# Patient Record
Sex: Female | Born: 1940
Health system: Southern US, Community
[De-identification: ages and names within clinical notes are randomized; demographics above are authoritative.]

## PROBLEM LIST (undated history)

## (undated) DIAGNOSIS — K5792 Diverticulitis of intestine, part unspecified, without perforation or abscess without bleeding: Secondary | ICD-10-CM

## (undated) DIAGNOSIS — K649 Unspecified hemorrhoids: Secondary | ICD-10-CM

## (undated) DIAGNOSIS — K219 Gastro-esophageal reflux disease without esophagitis: Secondary | ICD-10-CM

## (undated) DIAGNOSIS — I10 Essential (primary) hypertension: Secondary | ICD-10-CM

## (undated) HISTORY — DX: Unspecified hemorrhoids: K64.9

## (undated) HISTORY — DX: Gastro-esophageal reflux disease without esophagitis: K21.9

## (undated) HISTORY — PX: CHOLECYSTECTOMY: SHX55

## (undated) HISTORY — DX: Essential (primary) hypertension: I10

## (undated) HISTORY — PX: SPINE SURGERY: SHX786

## (undated) HISTORY — PX: HEMORROIDECTOMY: SUR656

---

## 1969-08-21 HISTORY — PX: VAGINAL HYSTERECTOMY: SUR661

## 2013-01-21 ENCOUNTER — Encounter (INDEPENDENT_AMBULATORY_CARE_PROVIDER_SITE_OTHER): Payer: Medicare Other | Admitting: Family Medicine

## 2013-01-21 DIAGNOSIS — I10 Essential (primary) hypertension: Secondary | ICD-10-CM

## 2013-01-21 DIAGNOSIS — R5383 Other fatigue: Secondary | ICD-10-CM

## 2013-01-21 DIAGNOSIS — R5381 Other malaise: Secondary | ICD-10-CM

## 2013-01-21 DIAGNOSIS — E559 Vitamin D deficiency, unspecified: Secondary | ICD-10-CM

## 2013-01-21 LAB — POCT CBC
Granulocyte percent: 60.9 %G (ref 37–80)
HCT, POC: 41.8 % (ref 37.7–47.9)
Hemoglobin: 14.3 g/dL (ref 12.2–16.2)
Lymph, poc: 1.8 (ref 0.6–3.4)
MCH, POC: 29.1 pg (ref 27–31.2)
MCHC: 34.1 g/dL (ref 31.8–35.4)
MCV: 85.4 fL (ref 80–97)
MPV: 7.7 fL (ref 0–99.8)
POC Granulocyte: 3.3 (ref 2–6.9)
POC LYMPH PERCENT: 33.5 %L (ref 10–50)
Platelet Count, POC: 227 10*3/uL (ref 142–424)
RBC: 4.9 M/uL (ref 4.04–5.48)
RDW, POC: 13.9 %
WBC: 5.4 10*3/uL (ref 4.6–10.2)

## 2013-01-21 LAB — COMPLETE METABOLIC PANEL WITH GFR
ALT: 25 U/L (ref 0–35)
AST: 21 U/L (ref 0–37)
Albumin: 3.8 g/dL (ref 3.5–5.2)
Alkaline Phosphatase: 71 U/L (ref 39–117)
BUN: 17 mg/dL (ref 6–23)
CO2: 27 mEq/L (ref 19–32)
Calcium: 9.5 mg/dL (ref 8.4–10.5)
Chloride: 103 mEq/L (ref 96–112)
Creat: 0.83 mg/dL (ref 0.50–1.10)
GFR, Est African American: 81 mL/min
GFR, Est Non African American: 70 mL/min
Glucose, Bld: 95 mg/dL (ref 70–99)
Potassium: 4.3 mEq/L (ref 3.5–5.3)
Sodium: 144 mEq/L (ref 135–145)
Total Bilirubin: 0.5 mg/dL (ref 0.3–1.2)
Total Protein: 6.8 g/dL (ref 6.0–8.3)

## 2013-01-21 LAB — LIPID PANEL
Cholesterol: 127 mg/dL (ref 0–200)
HDL: 35 mg/dL — ABNORMAL LOW (ref >39)
LDL Cholesterol: 45 mg/dL (ref 0–99)
Total CHOL/HDL Ratio: 3.6 Ratio
Triglycerides: 237 mg/dL — ABNORMAL HIGH (ref <150)
VLDL: 47 mg/dL — ABNORMAL HIGH (ref 0–40)

## 2013-01-21 LAB — THYROID PANEL WITH TSH
Free Thyroxine Index: 2.3 (ref 1.0–3.9)
T3 Uptake: 33.1 % (ref 22.5–37.0)
T4, Total: 6.9 ug/dL (ref 5.0–12.5)
TSH: 1.867 u[IU]/mL (ref 0.350–4.500)

## 2013-01-22 LAB — VITAMIN D 25 HYDROXY (VIT D DEFICIENCY, FRACTURES): Vit D, 25-Hydroxy: 31 ng/mL (ref 30–89)

## 2013-01-22 NOTE — Progress Notes (Signed)
Quick Note:  Labs abnormal. Needs to see the pharmacist to review labs and treatment. ______

## 2013-01-27 ENCOUNTER — Telehealth: Payer: Self-pay | Admitting: Family Medicine

## 2013-01-28 NOTE — Telephone Encounter (Signed)
PT NOTIIFED BY April IN LAB FOR TEST RESULTS

## 2013-01-29 ENCOUNTER — Telehealth: Payer: Self-pay | Admitting: Family Medicine

## 2013-01-29 NOTE — Telephone Encounter (Signed)
Pt aware.

## 2013-02-07 ENCOUNTER — Other Ambulatory Visit: Payer: Self-pay | Admitting: Family Medicine

## 2013-02-10 ENCOUNTER — Other Ambulatory Visit: Payer: Self-pay | Admitting: *Deleted

## 2013-02-10 NOTE — Telephone Encounter (Signed)
LAST OV 2/14. 

## 2013-02-18 ENCOUNTER — Telehealth: Payer: Self-pay | Admitting: Family Medicine

## 2013-02-18 NOTE — Telephone Encounter (Signed)
appt changed to earlier date

## 2013-03-03 ENCOUNTER — Encounter: Payer: Self-pay | Admitting: Family Medicine

## 2013-03-03 ENCOUNTER — Ambulatory Visit (INDEPENDENT_AMBULATORY_CARE_PROVIDER_SITE_OTHER): Payer: Medicare Other | Admitting: Family Medicine

## 2013-03-03 VITALS — BP 153/85 | HR 81 | Temp 98.4°F | Wt 157.4 lb

## 2013-03-03 DIAGNOSIS — H8110 Benign paroxysmal vertigo, unspecified ear: Secondary | ICD-10-CM | POA: Insufficient documentation

## 2013-03-03 DIAGNOSIS — I10 Essential (primary) hypertension: Secondary | ICD-10-CM

## 2013-03-03 DIAGNOSIS — E785 Hyperlipidemia, unspecified: Secondary | ICD-10-CM | POA: Insufficient documentation

## 2013-03-03 DIAGNOSIS — J309 Allergic rhinitis, unspecified: Secondary | ICD-10-CM | POA: Insufficient documentation

## 2013-03-03 DIAGNOSIS — J019 Acute sinusitis, unspecified: Secondary | ICD-10-CM | POA: Insufficient documentation

## 2013-03-03 DIAGNOSIS — K219 Gastro-esophageal reflux disease without esophagitis: Secondary | ICD-10-CM | POA: Insufficient documentation

## 2013-03-03 DIAGNOSIS — J342 Deviated nasal septum: Secondary | ICD-10-CM | POA: Insufficient documentation

## 2013-03-03 MED ORDER — FLUTICASONE PROPIONATE 50 MCG/ACT NA SUSP: 2.0000 | Freq: Every day | NASAL | Status: DC

## 2013-03-03 MED ORDER — AMOXICILLIN 875 MG PO TABS: 875.0000 mg | ORAL_TABLET | Freq: Two times a day (BID) | ORAL | Status: DC

## 2013-03-03 NOTE — Progress Notes (Signed)
Patient ID: Melanie Todd, female   DOB: 11-Dec-1940, 72 y.o.   MRN: [id number] SUBJECTIVE: CC: Chief Complaint  Patient presents with  . Follow-up    3 month ck  discus changing bp med. . c/o drainage   HPI: Nasal congestion and  Dizziness from inner ear.  Also Patient is here for follow up of hypertension: denies Headache;deniesChest Pain;denies weakness;denies Shortness of Breath or Orthopnea;denies Visual changes;denies palpitations;denies cough;denies pedal edema;denies symptoms of TIA or stroke; admits to Compliance with medications. denies Problems with medications.  Past Medical History  Diagnosis Date  . Hypertension   . GERD (gastroesophageal reflux disease)    Past Surgical History  Procedure Laterality Date  . Spine surgery    . Hemorroidectomy     History   Social History  . Marital Status: Married    Spouse Name: N/A    Number of Children: N/A  . Years of Education: N/A   Occupational History  . Not on file.   Social History Main Topics  . Smoking status: Former Smoker    Quit date: 03/04/2003  . Smokeless tobacco: Not on file  . Alcohol Use: Not on file  . Drug Use: Not on file  . Sexually Active: Not on file   Other Topics Concern  . Not on file   Social History Narrative  . No narrative on file   Family History  Problem Relation Age of Onset  . Diabetes Mother   . Heart disease Mother   . Cancer Father     colon  . Cancer Brother     colon   Current Outpatient Prescriptions on File Prior to Visit  Medication Sig Dispense Refill  . verapamil (CALAN-SR) 240 MG CR tablet TAKE 1 TABLET BY MOUTH EVERY DAY  30 tablet  2   No current facility-administered medications on file prior to visit.   Allergies  Allergen Reactions  . Codeine   . Sulfa Antibiotics     There is no immunization history on file for this patient. Prior to Admission medications   Medication Sig Start Date End Date Taking? Authorizing Provider  verapamil (CALAN-SR) 240  MG CR tablet TAKE 1 TABLET BY MOUTH EVERY DAY 02/07/13  Yes Ernestina Penna, MD     ROS: As above in the HPI. All other systems are stable or negative.  OBJECTIVE: APPEARANCE:  Patient in no acute distress.The patient appeared well nourished and normally developed. Acyanotic. Waist: VITAL SIGNS:BP 153/85  Pulse 81  Temp(Src) 98.4 F (36.9 C) (Oral)  Wt 157 lb 6.4 oz (71.396 kg) WF  Recheck 135/85 Tilt negative.   SKIN: warm and  Dry without overt rashes, tattoos and scars  HEAD and Neck: without JVD, Head and scalp: normal Eyes:No scleral icterus. Fundi normal, eye movements normal. Ears: Auricle normal, canal normal, Tympanic membranes small effusion., insufflation normal. Nose: septal deviation and  Nasal boggy swollen mucosa. Throat: normal Neck & thyroid: normal  CHEST & LUNGS: Chest wall: normal Lungs: Clear  CVS: Reveals the PMI to be normally located. Regular rhythm, First and Second Heart sounds are normal,  absence of murmurs, rubs or gallops. Peripheral vasculature: Radial pulses: normal Dorsal pedis pulses: normal Posterior pulses: normal  ABDOMEN:  Appearance: normal Benign, no organomegaly, no masses, no Abdominal Aortic enlargement. No Guarding , no rebound. No Bruits. Bowel sounds: normal  RECTAL: N/A GU: N/A  EXTREMETIES: nonedematous. Both Femoral and Pedal pulses are normal.  MUSCULOSKELETAL:  Spine: normal Joints: intact  NEUROLOGIC: oriented to  time,place and person; nonfocal. Strength is normal Sensory is normal Reflexes are normal Cranial Nerves are normal. Dix-hallpike mildly positive.  Results for orders placed in visit on 01/21/13  COMPLETE METABOLIC PANEL WITH GFR      Result Value Range   Sodium 144  135 - 145 mEq/L   Potassium 4.3  3.5 - 5.3 mEq/L   Chloride 103  96 - 112 mEq/L   CO2 27  19 - 32 mEq/L   Glucose, Bld 95  70 - 99 mg/dL   BUN 17  6 - 23 mg/dL   Creat 0.98  1.19 - 1.47 mg/dL   Total Bilirubin 0.5  0.3  - 1.2 mg/dL   Alkaline Phosphatase 71  39 - 117 U/L   AST 21  0 - 37 U/L   ALT 25  0 - 35 U/L   Total Protein 6.8  6.0 - 8.3 g/dL   Albumin 3.8  3.5 - 5.2 g/dL   Calcium 9.5  8.4 - 82.9 mg/dL   GFR, Est African American 81     GFR, Est Non African American 70    LIPID PANEL      Result Value Range   Cholesterol 127  0 - 200 mg/dL   Triglycerides 562 (*) <150 mg/dL   HDL 35 (*) >13 mg/dL   Total CHOL/HDL Ratio 3.6     VLDL 47 (*) 0 - 40 mg/dL   LDL Cholesterol 45  0 - 99 mg/dL  VITAMIN D 25 HYDROXY      Result Value Range   Vit D, 25-Hydroxy 31  30 - 89 ng/mL  THYROID PANEL WITH TSH      Result Value Range   T4, Total 6.9  5.0 - 12.5 ug/dL   T3 Uptake 08.6  57.8 - 37.0 %   Free Thyroxine Index 2.3  1.0 - 3.9   TSH 1.867  0.350 - 4.500 uIU/mL  POCT CBC      Result Value Range   WBC 5.4  4.6 - 10.2 K/uL   Lymph, poc 1.8  0.6 - 3.4   POC LYMPH PERCENT 33.5  10 - 50 %L   POC Granulocyte 3.3  2 - 6.9   Granulocyte percent 60.9  37 - 80 %G   RBC 4.9  4.04 - 5.48 M/uL   Hemoglobin 14.3  12.2 - 16.2 g/dL   HCT, POC 46.9  62.9 - 47.9 %   MCV 85.4  80 - 97 fL   MCH, POC 29.1  27 - 31.2 pg   MCHC 34.1  31.8 - 35.4 g/dL   RDW, POC 52.8     Platelet Count, POC 227.0  142 - 424 K/uL   MPV 7.7  0 - 99.8 fL    ASSESSMENT: HTN (hypertension)  GERD (gastroesophageal reflux disease) - resolved  HLD (hyperlipidemia)  Deviated nasal septum  Allergic rhinitis - Plan: fluticasone (FLONASE) 50 MCG/ACT nasal spray  Benign paroxysmal positional vertigo - Plan: fluticasone (FLONASE) 50 MCG/ACT nasal spray  Sinusitis, acute - Plan: amoxicillin (AMOXIL) 875 MG tablet  PLAN: Meds ordered this encounter  Medications  . fluticasone (FLONASE) 50 MCG/ACT nasal spray    Sig: Place 2 sprays into the nose daily.    Dispense:  16 g    Refill:  6  . amoxicillin (AMOXIL) 875 MG tablet    Sig: Take 1 tablet (875 mg total) by mouth 2 (two) times daily.    Dispense:  20 tablet    Refill:  0    Reviewed labs with patient. She is to follow up with the clinical pharmacist.  Return in about 3 months (around 06/03/2013) for Recheck medical problems,follow up with Michlle as planned.Thelma Barge P. [place] Charon, M.D.

## 2013-03-21 ENCOUNTER — Ambulatory Visit: Payer: Medicare Other | Admitting: Family Medicine

## 2013-04-09 IMAGING — CR DG HIP COMPLETE 2+V*R*
2 series · 2 of 2 positions shown · non-contrast
Comparison: None.

CLINICAL DATA: 2-week history of painful hip.

RIGHT HIP - COMPLETE 2+ VIEW

[view not recorded (1 of 2)]
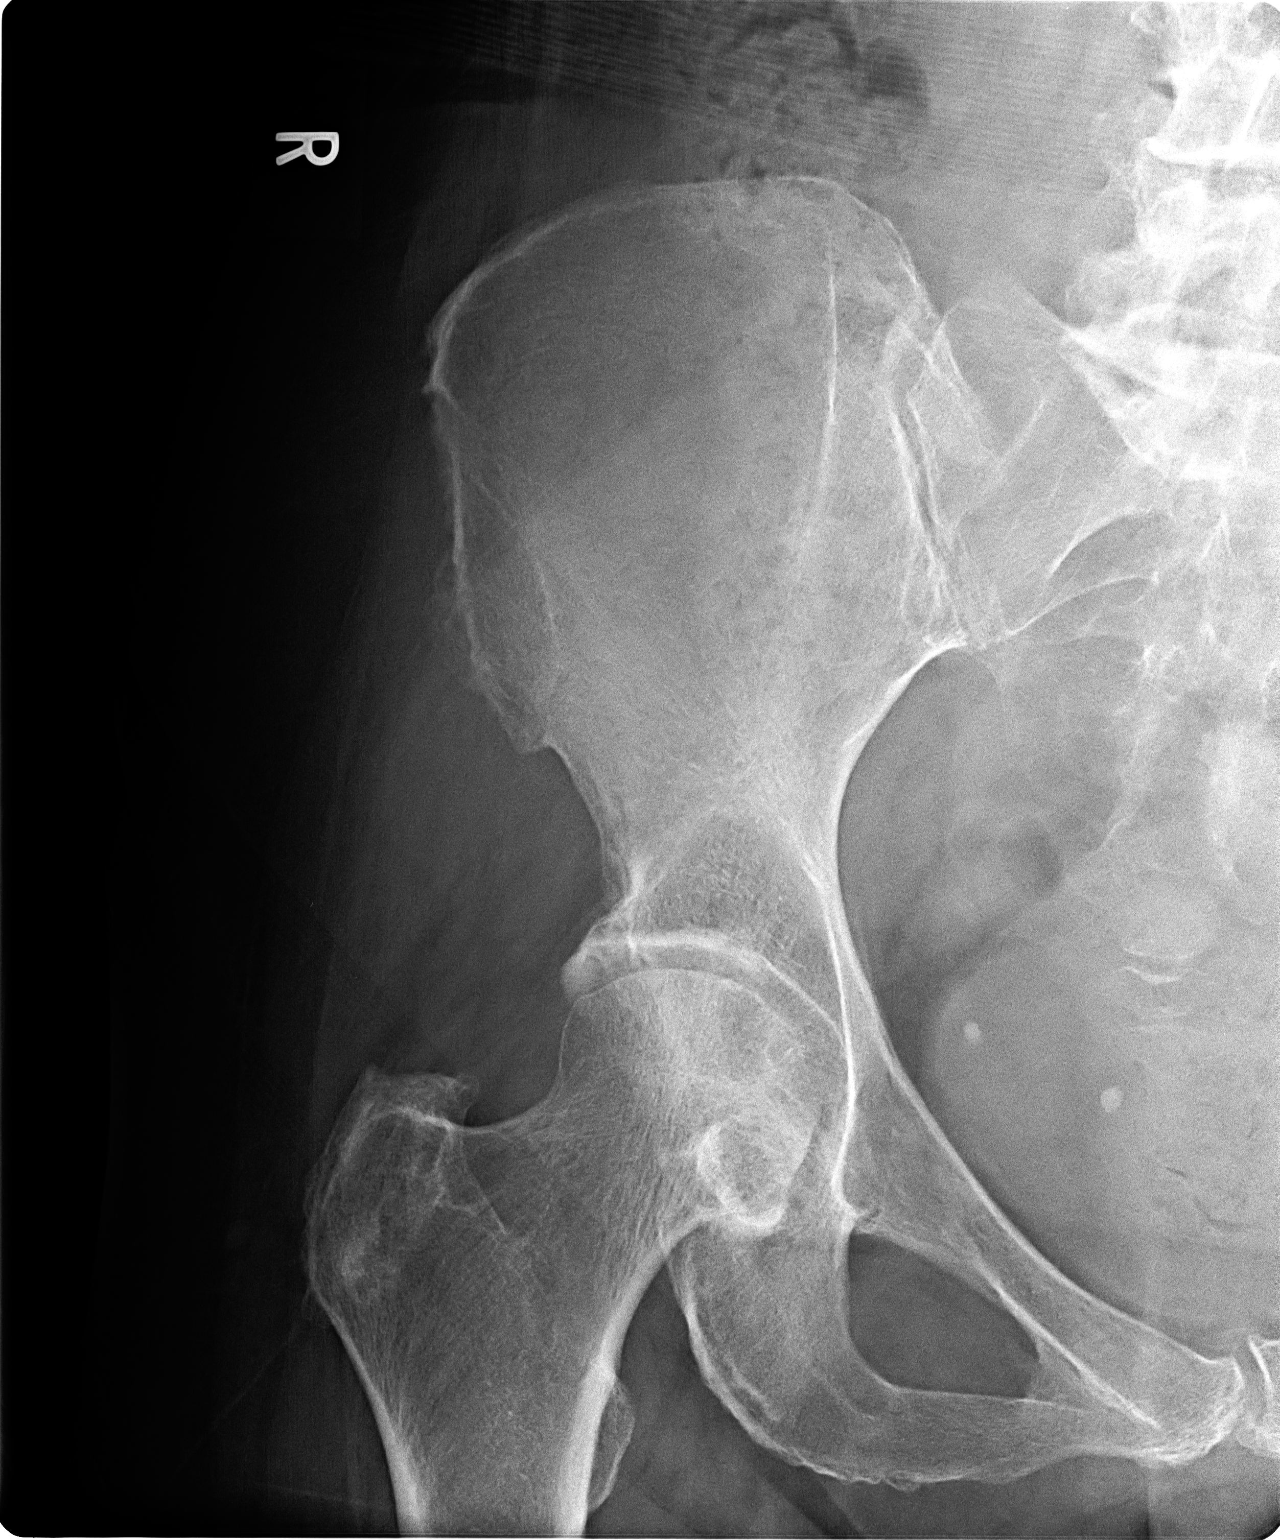

[view not recorded (2 of 2)]
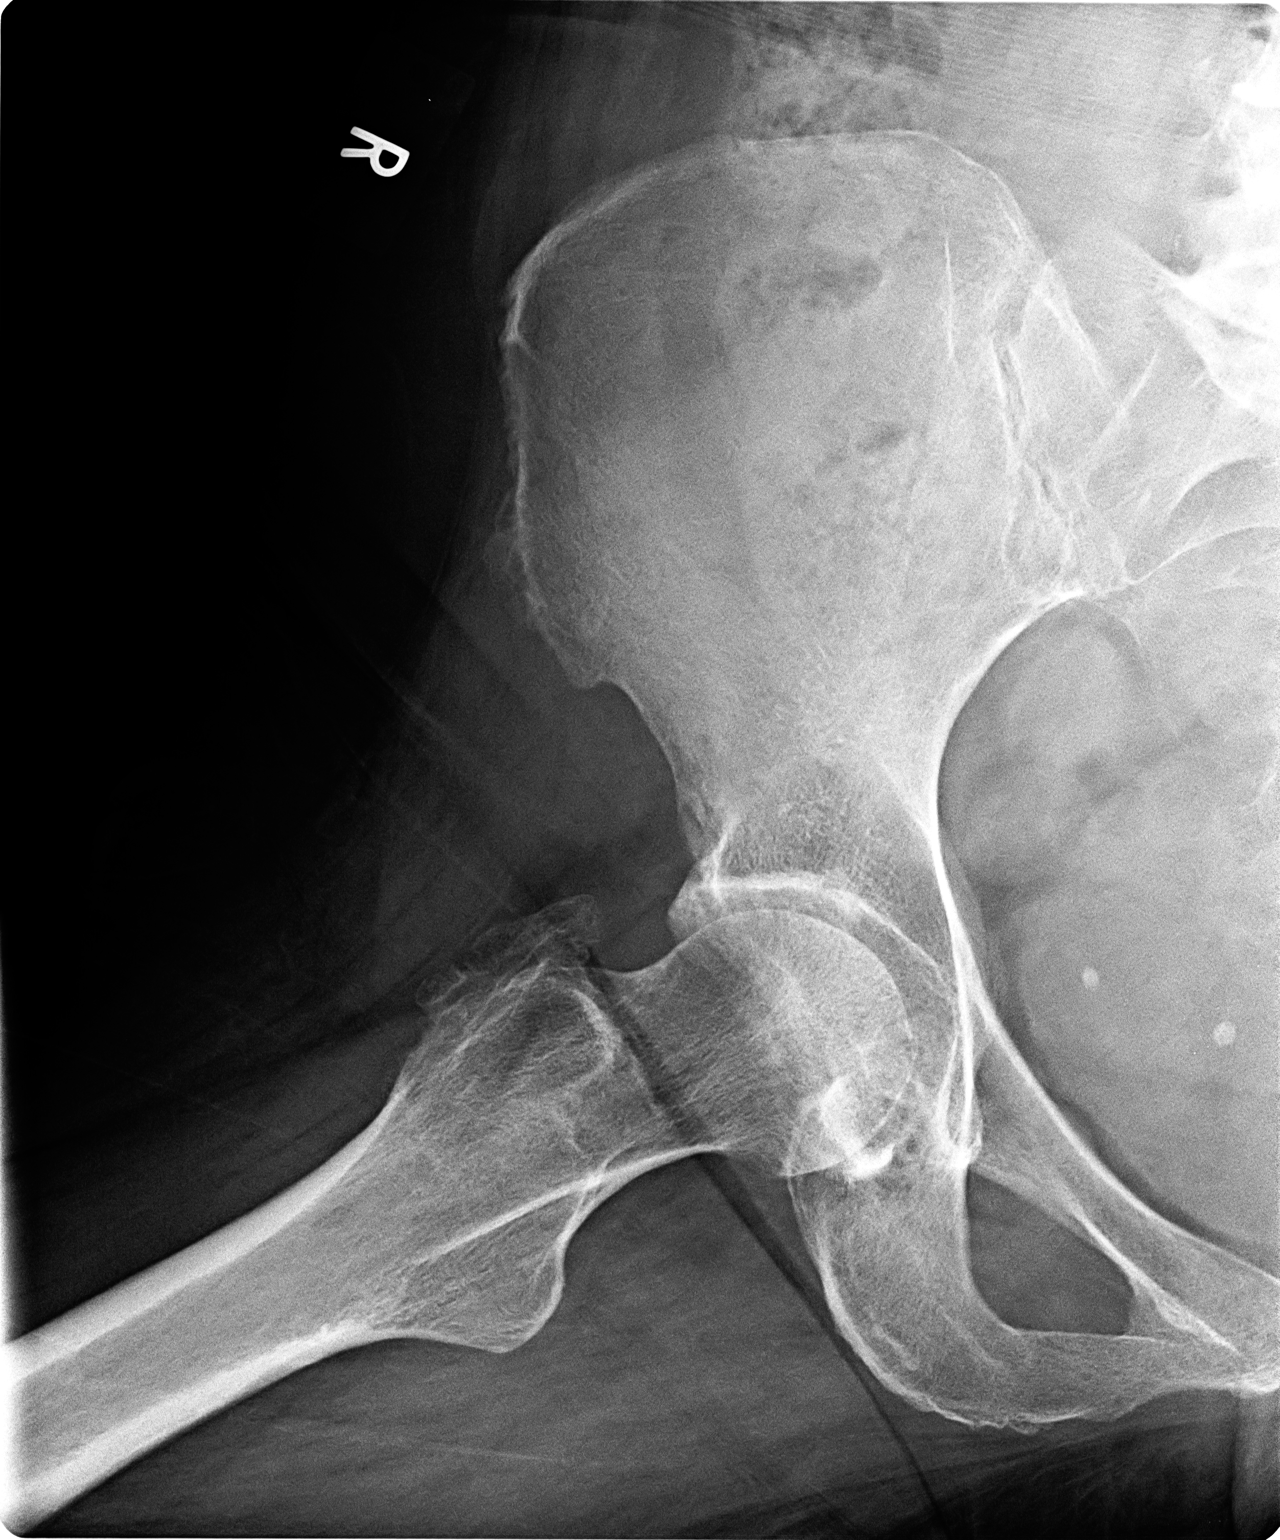

[2 of 2 positions shown; findings below may reference images not displayed]

FINDINGS: There is degenerative spondylosis.  Pelvic phleboliths
are present.  Hip joint space appears preserved.  There is moderate
acetabular spurring. No fracture or bony destruction is seen.  No
calcific bursitis is evident..
IMPRESSION: Moderate right acetabular spurring.  No fracture bony destruction.
No calcific bursitis.  Degenerative spondylosis.

## 2013-04-22 ENCOUNTER — Other Ambulatory Visit: Payer: Self-pay | Admitting: Family Medicine

## 2013-04-24 NOTE — Telephone Encounter (Signed)
Last seen 10/08/12  FPW

## 2013-06-05 ENCOUNTER — Ambulatory Visit: Payer: Medicare Other | Admitting: Family Medicine

## 2014-04-30 ENCOUNTER — Encounter: Payer: Self-pay | Admitting: Family Medicine

## 2014-04-30 ENCOUNTER — Ambulatory Visit (INDEPENDENT_AMBULATORY_CARE_PROVIDER_SITE_OTHER): Payer: Medicare Other | Admitting: Family Medicine

## 2014-04-30 VITALS — BP 123/75 | HR 76 | Temp 97.0°F | Ht 64.0 in | Wt 153.0 lb

## 2014-04-30 DIAGNOSIS — I10 Essential (primary) hypertension: Secondary | ICD-10-CM

## 2014-04-30 DIAGNOSIS — Z780 Asymptomatic menopausal state: Secondary | ICD-10-CM

## 2014-04-30 DIAGNOSIS — E785 Hyperlipidemia, unspecified: Secondary | ICD-10-CM

## 2014-04-30 DIAGNOSIS — Z13820 Encounter for screening for osteoporosis: Secondary | ICD-10-CM

## 2014-04-30 NOTE — Progress Notes (Signed)
   Subjective:    Patient ID: Melanie Todd, female    DOB: 01-15-1941, 73 y.o.   MRN: [id number]  HPI 73 year old female who is transferred here from [facility] where the physician extender treated her blood pressure. She has no problems with the verapamil which she takes for blood pressure. She also takes tramadol daily for what she calls sinus headaches. She has GERD and uses Prilosec on a daily basis. There is a remote history of cancer found when she had hemorrhoid surgery. That was about 6 years ago. She still sees a gynecologist for Pap smears.    Review of Systems  Constitutional: Negative.   Eyes: Negative.   Respiratory: Negative.   Cardiovascular: Negative.   Gastrointestinal: Negative.   Endocrine: Negative.   Genitourinary: Negative.   Musculoskeletal: Positive for myalgias.       Bilateral thumb pain  Skin: Rash: not true rash but healing intertrigo.  Neurological: Positive for headaches.  Hematological: Negative.   Psychiatric/Behavioral: Negative.        Objective:   Physical Exam  Constitutional: She is oriented to person, place, and time. She appears well-developed and well-nourished.  Eyes: Conjunctivae and EOM are normal.  Neck: Normal range of motion. Neck supple.  Cardiovascular: Normal rate, regular rhythm and normal heart sounds.   Pulmonary/Chest: Effort normal and breath sounds normal.  Abdominal: Soft. Bowel sounds are normal.  Musculoskeletal: Normal range of motion.  Neurological: She is alert and oriented to person, place, and time. She has normal reflexes.  Skin: Skin is warm and dry.  Psychiatric: She has a normal mood and affect. Her behavior is normal. Thought content normal.    BP 123/75  Pulse 76  Temp(Src) 97 F (36.1 C) (Oral)  Ht 5\' 4"  (1.626 m)  Wt 153 lb (69.4 kg)  BMI 26.25 kg/m2      Assessment & Plan:  1. Essential hypertension Continue with same med  2. HLD (hyperlipidemia) Had recent labs at outside office  Will send  for result  Needs bone density  Wardell Honour MD

## 2014-04-30 NOTE — Patient Instructions (Signed)

## 2014-05-27 ENCOUNTER — Encounter: Payer: Self-pay | Admitting: Pharmacist

## 2014-05-27 ENCOUNTER — Ambulatory Visit (INDEPENDENT_AMBULATORY_CARE_PROVIDER_SITE_OTHER): Payer: Medicare Other

## 2014-05-27 ENCOUNTER — Ambulatory Visit (INDEPENDENT_AMBULATORY_CARE_PROVIDER_SITE_OTHER): Payer: Medicare Other | Admitting: Pharmacist

## 2014-05-27 VITALS — Ht 64.0 in | Wt 153.0 lb

## 2014-05-27 DIAGNOSIS — Z23 Encounter for immunization: Secondary | ICD-10-CM

## 2014-05-27 DIAGNOSIS — Z780 Asymptomatic menopausal state: Secondary | ICD-10-CM

## 2014-05-27 DIAGNOSIS — M8580 Other specified disorders of bone density and structure, unspecified site: Secondary | ICD-10-CM | POA: Insufficient documentation

## 2014-05-27 LAB — HM DEXA SCAN

## 2014-05-27 NOTE — Progress Notes (Signed)
Patient ID: Melanie Todd, female   DOB: 1941/02/17, 73 y.o.   MRN: [id number] [facility] Current Height: Height: 5\' 4"  (162.6 cm)      Max Lifetime Height:  5\' 5"  Current Weight: Weight: 153 lb (69.4 kg)       Ethnicity:Caucasian    HPI: Does pt already have a diagnosis of:  Osteopenia?  No Osteoporosis?  No  Back Pain?  Yes    - history of back surgery    Kyphosis?  No Prior fracture?  No Med(s) for Osteoporosis/Osteopenia:  none Med(s) previously tried for Osteoporosis/Osteopenia:  none                                                             PMH: Age at menopause:  Surgical at 73yo Hysterectomy?  Yes Oophorectomy?  No HRT? No Steroid Use?  No Thyroid med?  No History of cancer?  Yes - skin History of digestive disorders (ie Crohn's)?  Yes - on chronic PPI therapy Current or previous eating disorders?  No Last Vitamin D Result:  31 (01/22/2013) Last GFR Result:  81 (01/21/2013)   FH/SH: Family history of osteoporosis?  Yes - mother and mat grandmother Parent with history of hip fracture?  No Family history of breast cancer?  No Exercise?  No Smoking?  No Alcohol?  No    Calcium Assessment Calcium Intake  # of servings/day  Calcium mg  Milk (8 oz) 1  x  300  = 300mg   Yogurt (4 oz) 0 x  200 = 0  Cheese (1 oz) 0 x  200 = 0  Other Calcium sources   250mg   Ca supplement 400mg  = 400mg    950mg     DEXA Results Date of Test T-Score for AP Spine L1-L4 T-Score for Total Left Hip T-Score for Total Right Hip  05/27/2014 0.8 -1.0 -1.3                   FRAX 10 year estimate: Total FX risk:  14%  (consider medication if >/= 20%) Hip FX risk:  3.8%  (consider medication if >/= 3%)  Assessment: Osteopenia with increased fracture risk  Recommendations: 1.  Discussed fracture risk and recommended evista -  Patient refused pharmacotherapy for osteopenia 2.  recommend calcium 1200mg  daily through supplementation or diet.  3.  recommend weight bearing exercise  - 30 minutes at least 4 days per week.   4.  Counseled and educated about fall risk and prevention. 5.   Influenza vaccine given in office today   Recheck DEXA:  2 years  Time spent counseling patient:  25 minutes

## 2014-05-27 NOTE — Patient Instructions (Signed)

## 2014-05-28 ENCOUNTER — Ambulatory Visit: Payer: Medicare Other | Admitting: Family Medicine

## 2014-06-29 ENCOUNTER — Other Ambulatory Visit: Payer: Self-pay

## 2014-06-29 MED ORDER — OMEPRAZOLE 20 MG PO CPDR: DELAYED_RELEASE_CAPSULE | ORAL | Status: DC

## 2014-07-23 ENCOUNTER — Telehealth: Payer: Self-pay | Admitting: Family Medicine

## 2014-07-23 NOTE — Telephone Encounter (Signed)
Please review

## 2014-07-24 ENCOUNTER — Other Ambulatory Visit: Payer: Self-pay

## 2014-07-24 DIAGNOSIS — R748 Abnormal levels of other serum enzymes: Secondary | ICD-10-CM

## 2014-07-24 NOTE — Telephone Encounter (Signed)
Okay to do referral to GI but would be unlikely that she will be seen as quickly as she indicated

## 2014-08-25 ENCOUNTER — Encounter: Payer: Self-pay | Admitting: Family Medicine

## 2014-08-25 ENCOUNTER — Ambulatory Visit (INDEPENDENT_AMBULATORY_CARE_PROVIDER_SITE_OTHER): Payer: Medicare Other | Admitting: Family Medicine

## 2014-08-25 VITALS — BP 159/77 | HR 70 | Temp 97.7°F | Ht 64.0 in | Wt 154.0 lb

## 2014-08-25 DIAGNOSIS — K219 Gastro-esophageal reflux disease without esophagitis: Secondary | ICD-10-CM

## 2014-08-25 DIAGNOSIS — I10 Essential (primary) hypertension: Secondary | ICD-10-CM

## 2014-08-25 NOTE — Progress Notes (Signed)
   Subjective:    Patient ID: Melanie Todd, female    DOB: [birth year], 74 y.o.   MRN: [id number]  HPI 74 year old female here to follow-up hypertension and chronic. I note that since her last visit here she had an elevated amylase done at outside urgent care. I do not know the significance of that as yet. She has apparently seen a gastroenterologist and undergone ultrasound as well as CT scan. Amylase elevation seems to be persistent and she has another follow-up visit in the coming weeks. She has had no further abdominal pain nausea or vomiting to suggest pancreatitis but they seem to be thinking more of a retained stone after her gallbladder surgery as a calls. There is no history of alcoholism or trauma    Review of Systems  Constitutional: Negative.   HENT: Negative.   Eyes: Negative.   Respiratory: Negative.   Cardiovascular: Negative.   Gastrointestinal: Positive for abdominal pain.  Endocrine: Negative.   Genitourinary: Negative.   Hematological: Negative.   Psychiatric/Behavioral: Negative.        Objective:   Physical Exam  Constitutional: She is oriented to person, place, and time. She appears well-developed and well-nourished.  Eyes: Conjunctivae and EOM are normal.  Neck: Normal range of motion. Neck supple.  Cardiovascular: Normal rate, regular rhythm and normal heart sounds.   Pulmonary/Chest: Effort normal and breath sounds normal.  Abdominal: Soft. Bowel sounds are normal.  Musculoskeletal: Normal range of motion.  Neurological: She is alert and oriented to person, place, and time. She has normal reflexes.  Skin: Skin is warm and dry.  Psychiatric: She has a normal mood and affect. Her behavior is normal. Thought content normal.   BP 159/77 mmHg  Pulse 70  Temp(Src) 97.7 F (36.5 C) (Oral)  Ht 5\' 4"  (1.626 m)  Wt 154 lb (69.854 kg)  BMI 26.42 kg/m2       Assessment & Plan:  1. Essential hypertension  BP elevated today; she will monitor  2.  Gastroesophageal reflux disease without esophagitis   Wardell Honour MD

## 2014-10-22 ENCOUNTER — Telehealth: Payer: Self-pay | Admitting: Family Medicine

## 2014-10-22 NOTE — Telephone Encounter (Signed)
Discussed with patient. This was documented as a historical medication but we haven't prescribed it for her. There is no documentation as to why she is taking this but she reports that it is for headaches and cervical disc disease. Advised that I will forward to Dr Sabra Heck to review when he returns on 10/27/14. Patient stated understanding.

## 2014-10-22 NOTE — Telephone Encounter (Signed)
i dont see where we have filled this here?

## 2014-10-26 ENCOUNTER — Telehealth: Payer: Self-pay | Admitting: Family Medicine

## 2014-10-27 MED ORDER — TRAMADOL HCL 50 MG PO TABS: 50.0000 mg | ORAL_TABLET | Freq: Every day | ORAL | Status: DC

## 2014-10-27 NOTE — Telephone Encounter (Signed)
Request for tramadol refill

## 2014-10-27 NOTE — Telephone Encounter (Signed)
Patient aware that script is ready to be picked up with photo ID 

## 2015-04-05 ENCOUNTER — Telehealth: Payer: Self-pay | Admitting: *Deleted

## 2015-04-05 NOTE — Telephone Encounter (Signed)
lmtcb to schedule follow up and recheck BP.

## 2015-10-07 DIAGNOSIS — R300 Dysuria: Secondary | ICD-10-CM | POA: Diagnosis not present

## 2015-10-07 DIAGNOSIS — B373 Candidiasis of vulva and vagina: Secondary | ICD-10-CM | POA: Diagnosis not present

## 2015-10-21 DIAGNOSIS — R42 Dizziness and giddiness: Secondary | ICD-10-CM | POA: Diagnosis not present

## 2015-10-21 DIAGNOSIS — J329 Chronic sinusitis, unspecified: Secondary | ICD-10-CM | POA: Diagnosis not present

## 2015-11-11 DIAGNOSIS — Z09 Encounter for follow-up examination after completed treatment for conditions other than malignant neoplasm: Secondary | ICD-10-CM | POA: Diagnosis not present

## 2015-11-11 DIAGNOSIS — D013 Carcinoma in situ of anus and anal canal: Secondary | ICD-10-CM | POA: Diagnosis not present

## 2015-11-24 DIAGNOSIS — H40003 Preglaucoma, unspecified, bilateral: Secondary | ICD-10-CM | POA: Diagnosis not present

## 2015-11-24 DIAGNOSIS — H269 Unspecified cataract: Secondary | ICD-10-CM | POA: Diagnosis not present

## 2015-11-25 DIAGNOSIS — Z800 Family history of malignant neoplasm of digestive organs: Secondary | ICD-10-CM | POA: Diagnosis not present

## 2015-11-25 DIAGNOSIS — Z1211 Encounter for screening for malignant neoplasm of colon: Secondary | ICD-10-CM | POA: Diagnosis not present

## 2015-11-25 DIAGNOSIS — K921 Melena: Secondary | ICD-10-CM | POA: Diagnosis not present

## 2015-11-25 DIAGNOSIS — K5730 Diverticulosis of large intestine without perforation or abscess without bleeding: Secondary | ICD-10-CM | POA: Diagnosis not present

## 2015-12-31 DIAGNOSIS — N952 Postmenopausal atrophic vaginitis: Secondary | ICD-10-CM | POA: Diagnosis not present

## 2015-12-31 DIAGNOSIS — Z1272 Encounter for screening for malignant neoplasm of vagina: Secondary | ICD-10-CM | POA: Diagnosis not present

## 2015-12-31 DIAGNOSIS — N3090 Cystitis, unspecified without hematuria: Secondary | ICD-10-CM | POA: Diagnosis not present

## 2015-12-31 DIAGNOSIS — L819 Disorder of pigmentation, unspecified: Secondary | ICD-10-CM | POA: Diagnosis not present

## 2016-01-07 DIAGNOSIS — H40003 Preglaucoma, unspecified, bilateral: Secondary | ICD-10-CM | POA: Diagnosis not present

## 2016-01-26 DIAGNOSIS — B370 Candidal stomatitis: Secondary | ICD-10-CM | POA: Diagnosis not present

## 2016-01-26 DIAGNOSIS — J0140 Acute pansinusitis, unspecified: Secondary | ICD-10-CM | POA: Diagnosis not present

## 2016-01-26 DIAGNOSIS — J029 Acute pharyngitis, unspecified: Secondary | ICD-10-CM | POA: Diagnosis not present

## 2016-02-15 DIAGNOSIS — L820 Inflamed seborrheic keratosis: Secondary | ICD-10-CM | POA: Diagnosis not present

## 2016-02-15 DIAGNOSIS — D229 Melanocytic nevi, unspecified: Secondary | ICD-10-CM | POA: Diagnosis not present

## 2016-02-15 DIAGNOSIS — L814 Other melanin hyperpigmentation: Secondary | ICD-10-CM | POA: Diagnosis not present

## 2016-02-15 DIAGNOSIS — D485 Neoplasm of uncertain behavior of skin: Secondary | ICD-10-CM | POA: Diagnosis not present

## 2016-02-15 DIAGNOSIS — C44722 Squamous cell carcinoma of skin of right lower limb, including hip: Secondary | ICD-10-CM | POA: Diagnosis not present

## 2016-02-29 DIAGNOSIS — H9203 Otalgia, bilateral: Secondary | ICD-10-CM | POA: Diagnosis not present

## 2016-02-29 DIAGNOSIS — H6983 Other specified disorders of Eustachian tube, bilateral: Secondary | ICD-10-CM | POA: Diagnosis not present

## 2016-02-29 DIAGNOSIS — H8113 Benign paroxysmal vertigo, bilateral: Secondary | ICD-10-CM | POA: Diagnosis not present

## 2016-03-23 DIAGNOSIS — C44722 Squamous cell carcinoma of skin of right lower limb, including hip: Secondary | ICD-10-CM | POA: Diagnosis not present

## 2016-05-01 DIAGNOSIS — N761 Subacute and chronic vaginitis: Secondary | ICD-10-CM | POA: Diagnosis not present

## 2016-05-14 DIAGNOSIS — J329 Chronic sinusitis, unspecified: Secondary | ICD-10-CM | POA: Diagnosis not present

## 2016-05-23 DIAGNOSIS — M545 Low back pain: Secondary | ICD-10-CM | POA: Diagnosis not present

## 2016-05-23 DIAGNOSIS — J019 Acute sinusitis, unspecified: Secondary | ICD-10-CM | POA: Diagnosis not present

## 2016-05-23 DIAGNOSIS — I10 Essential (primary) hypertension: Secondary | ICD-10-CM | POA: Diagnosis not present

## 2016-05-23 DIAGNOSIS — K219 Gastro-esophageal reflux disease without esophagitis: Secondary | ICD-10-CM | POA: Diagnosis not present

## 2016-05-30 DIAGNOSIS — L292 Pruritus vulvae: Secondary | ICD-10-CM | POA: Diagnosis not present

## 2016-06-16 DIAGNOSIS — Z23 Encounter for immunization: Secondary | ICD-10-CM | POA: Diagnosis not present

## 2016-07-03 DIAGNOSIS — Z1231 Encounter for screening mammogram for malignant neoplasm of breast: Secondary | ICD-10-CM | POA: Diagnosis not present

## 2016-07-05 DIAGNOSIS — H40013 Open angle with borderline findings, low risk, bilateral: Secondary | ICD-10-CM | POA: Diagnosis not present

## 2016-07-05 DIAGNOSIS — H2513 Age-related nuclear cataract, bilateral: Secondary | ICD-10-CM | POA: Diagnosis not present

## 2016-07-11 DIAGNOSIS — J0140 Acute pansinusitis, unspecified: Secondary | ICD-10-CM | POA: Diagnosis not present

## 2016-07-14 DIAGNOSIS — H8113 Benign paroxysmal vertigo, bilateral: Secondary | ICD-10-CM | POA: Diagnosis not present

## 2016-10-02 DIAGNOSIS — R590 Localized enlarged lymph nodes: Secondary | ICD-10-CM | POA: Diagnosis not present

## 2016-10-29 DIAGNOSIS — R51 Headache: Secondary | ICD-10-CM | POA: Diagnosis not present

## 2016-10-29 DIAGNOSIS — J3489 Other specified disorders of nose and nasal sinuses: Secondary | ICD-10-CM | POA: Diagnosis not present

## 2016-10-29 DIAGNOSIS — J329 Chronic sinusitis, unspecified: Secondary | ICD-10-CM | POA: Diagnosis not present

## 2016-11-09 DIAGNOSIS — D013 Carcinoma in situ of anus and anal canal: Secondary | ICD-10-CM | POA: Diagnosis not present

## 2016-11-23 DIAGNOSIS — I10 Essential (primary) hypertension: Secondary | ICD-10-CM | POA: Diagnosis not present

## 2016-11-23 DIAGNOSIS — R0981 Nasal congestion: Secondary | ICD-10-CM | POA: Diagnosis not present

## 2016-11-23 DIAGNOSIS — M79606 Pain in leg, unspecified: Secondary | ICD-10-CM | POA: Diagnosis not present

## 2016-11-23 DIAGNOSIS — M6281 Muscle weakness (generalized): Secondary | ICD-10-CM | POA: Diagnosis not present

## 2016-11-23 DIAGNOSIS — K219 Gastro-esophageal reflux disease without esophagitis: Secondary | ICD-10-CM | POA: Diagnosis not present

## 2016-12-18 DIAGNOSIS — R634 Abnormal weight loss: Secondary | ICD-10-CM | POA: Diagnosis not present

## 2016-12-18 DIAGNOSIS — M5417 Radiculopathy, lumbosacral region: Secondary | ICD-10-CM | POA: Diagnosis not present

## 2016-12-18 DIAGNOSIS — M545 Low back pain: Secondary | ICD-10-CM | POA: Diagnosis not present

## 2016-12-18 DIAGNOSIS — G603 Idiopathic progressive neuropathy: Secondary | ICD-10-CM | POA: Diagnosis not present

## 2016-12-18 DIAGNOSIS — G5601 Carpal tunnel syndrome, right upper limb: Secondary | ICD-10-CM | POA: Diagnosis not present

## 2016-12-18 DIAGNOSIS — E531 Pyridoxine deficiency: Secondary | ICD-10-CM | POA: Diagnosis not present

## 2016-12-18 DIAGNOSIS — R202 Paresthesia of skin: Secondary | ICD-10-CM | POA: Diagnosis not present

## 2016-12-18 DIAGNOSIS — M5412 Radiculopathy, cervical region: Secondary | ICD-10-CM | POA: Diagnosis not present

## 2016-12-18 DIAGNOSIS — E538 Deficiency of other specified B group vitamins: Secondary | ICD-10-CM | POA: Diagnosis not present

## 2016-12-18 DIAGNOSIS — M79606 Pain in leg, unspecified: Secondary | ICD-10-CM | POA: Diagnosis not present

## 2016-12-18 DIAGNOSIS — R201 Hypoesthesia of skin: Secondary | ICD-10-CM | POA: Diagnosis not present

## 2016-12-18 DIAGNOSIS — G609 Hereditary and idiopathic neuropathy, unspecified: Secondary | ICD-10-CM | POA: Diagnosis not present

## 2017-01-11 DIAGNOSIS — J019 Acute sinusitis, unspecified: Secondary | ICD-10-CM | POA: Diagnosis not present

## 2017-01-11 DIAGNOSIS — R300 Dysuria: Secondary | ICD-10-CM | POA: Diagnosis not present

## 2017-01-11 DIAGNOSIS — B9689 Other specified bacterial agents as the cause of diseases classified elsewhere: Secondary | ICD-10-CM | POA: Diagnosis not present

## 2017-02-13 DIAGNOSIS — S40862A Insect bite (nonvenomous) of left upper arm, initial encounter: Secondary | ICD-10-CM | POA: Diagnosis not present

## 2017-02-13 DIAGNOSIS — S30860A Insect bite (nonvenomous) of lower back and pelvis, initial encounter: Secondary | ICD-10-CM | POA: Diagnosis not present

## 2017-02-13 DIAGNOSIS — W57XXXA Bitten or stung by nonvenomous insect and other nonvenomous arthropods, initial encounter: Secondary | ICD-10-CM | POA: Diagnosis not present

## 2017-04-05 DIAGNOSIS — J329 Chronic sinusitis, unspecified: Secondary | ICD-10-CM | POA: Diagnosis not present

## 2017-04-28 DIAGNOSIS — K219 Gastro-esophageal reflux disease without esophagitis: Secondary | ICD-10-CM | POA: Diagnosis not present

## 2017-04-28 DIAGNOSIS — M47892 Other spondylosis, cervical region: Secondary | ICD-10-CM | POA: Diagnosis not present

## 2017-04-28 DIAGNOSIS — E042 Nontoxic multinodular goiter: Secondary | ICD-10-CM | POA: Diagnosis not present

## 2017-04-28 DIAGNOSIS — R55 Syncope and collapse: Secondary | ICD-10-CM | POA: Diagnosis not present

## 2017-04-28 DIAGNOSIS — M5021 Other cervical disc displacement,  high cervical region: Secondary | ICD-10-CM | POA: Diagnosis not present

## 2017-04-28 DIAGNOSIS — Z79899 Other long term (current) drug therapy: Secondary | ICD-10-CM | POA: Diagnosis not present

## 2017-04-28 DIAGNOSIS — R110 Nausea: Secondary | ICD-10-CM | POA: Diagnosis not present

## 2017-04-28 DIAGNOSIS — Z885 Allergy status to narcotic agent status: Secondary | ICD-10-CM | POA: Diagnosis not present

## 2017-04-28 DIAGNOSIS — Z87891 Personal history of nicotine dependence: Secondary | ICD-10-CM | POA: Diagnosis not present

## 2017-04-28 DIAGNOSIS — R42 Dizziness and giddiness: Secondary | ICD-10-CM | POA: Diagnosis not present

## 2017-04-28 DIAGNOSIS — M542 Cervicalgia: Secondary | ICD-10-CM | POA: Diagnosis not present

## 2017-04-28 DIAGNOSIS — Z859 Personal history of malignant neoplasm, unspecified: Secondary | ICD-10-CM | POA: Diagnosis not present

## 2017-04-28 DIAGNOSIS — R51 Headache: Secondary | ICD-10-CM | POA: Diagnosis not present

## 2017-04-28 DIAGNOSIS — I10 Essential (primary) hypertension: Secondary | ICD-10-CM | POA: Diagnosis not present

## 2017-04-28 DIAGNOSIS — Z882 Allergy status to sulfonamides status: Secondary | ICD-10-CM | POA: Diagnosis not present

## 2017-05-08 DIAGNOSIS — M62838 Other muscle spasm: Secondary | ICD-10-CM | POA: Diagnosis not present

## 2017-05-08 DIAGNOSIS — M5412 Radiculopathy, cervical region: Secondary | ICD-10-CM | POA: Diagnosis not present

## 2017-05-08 DIAGNOSIS — M542 Cervicalgia: Secondary | ICD-10-CM | POA: Diagnosis not present

## 2017-05-08 DIAGNOSIS — B001 Herpesviral vesicular dermatitis: Secondary | ICD-10-CM | POA: Diagnosis not present

## 2017-05-11 ENCOUNTER — Ambulatory Visit (INDEPENDENT_AMBULATORY_CARE_PROVIDER_SITE_OTHER): Payer: Medicare Other | Admitting: Family Medicine

## 2017-05-11 ENCOUNTER — Encounter: Payer: Self-pay | Admitting: Family Medicine

## 2017-05-11 ENCOUNTER — Encounter (INDEPENDENT_AMBULATORY_CARE_PROVIDER_SITE_OTHER): Payer: Self-pay

## 2017-05-11 VITALS — BP 128/76 | HR 64 | Temp 97.1°F | Ht 64.0 in | Wt 160.0 lb

## 2017-05-11 DIAGNOSIS — M542 Cervicalgia: Secondary | ICD-10-CM | POA: Diagnosis not present

## 2017-05-11 DIAGNOSIS — M5020 Other cervical disc displacement, unspecified cervical region: Secondary | ICD-10-CM | POA: Diagnosis not present

## 2017-05-11 DIAGNOSIS — J0101 Acute recurrent maxillary sinusitis: Secondary | ICD-10-CM | POA: Diagnosis not present

## 2017-05-11 MED ORDER — FLUTICASONE PROPIONATE 50 MCG/ACT NA SUSP: 1.0000 | Freq: Two times a day (BID) | NASAL | 6 refills | Status: DC | PRN

## 2017-05-11 MED ORDER — AZITHROMYCIN 250 MG PO TABS: ORAL_TABLET | ORAL | 0 refills | Status: DC

## 2017-05-11 NOTE — Progress Notes (Signed)
BP 128/76   Pulse 64   Temp (!) 97.1 F (36.2 C) (Oral)   Ht 5\' 4"  (1.626 m)   Wt 160 lb (72.6 kg)   BMI 27.46 kg/m    Subjective:    Patient ID: Melanie Todd, female    DOB: July 29, 1941, 76 y.o.   MRN: [id number]  HPI: Shavaun Osterloh is a 76 y.o. female presenting on 05/11/2017 for Neck Pain (ongoing for quite a while, would like to be referred to [facility] [phone], [place] Allegra [place]) and Sinusitis (sinus drainage, headache, light headed)   HPI Neck pain Patient had a recent CT scan was diagnosed with C3-4 disc herniation centrally. She has been having pain extending in both sides of her neck and down into the tops of her shoulders in the muscles. She does not want to go for surgery and would like to try and do physical therapy first and see if she can get better with this. She denies any numbness or weakness. She did initially have some pain shooting down her right arm but that is gone now. She denies any fevers or chills or redness or warmth.  Sinus congestion and dizziness Patient has been having sinus congestion and dizziness for the past 3 days. She complains of sinus congestion and drainage and pressure both above and below her eyes on both sides and having postnasal drainage and a little bit of a sore throat. She denies any major cough. She denies any fevers or chills or shortness of breath or wheezing  Relevant past medical, surgical, family and social history reviewed and updated as indicated. Interim medical history since our last visit reviewed. Allergies and medications reviewed and updated.  Review of Systems  Constitutional: Negative for chills and fever.  HENT: Positive for congestion, postnasal drip, rhinorrhea, sinus pressure and sore throat. Negative for ear discharge, ear pain and sneezing.   Eyes: Negative for pain, redness and visual disturbance.  Respiratory: Negative for cough, chest tightness, shortness of breath and wheezing.     Cardiovascular: Negative for chest pain and leg swelling.  Musculoskeletal: Positive for neck pain. Negative for back pain and gait problem.  Skin: Negative for rash.  Neurological: Negative for light-headedness and headaches.  Psychiatric/Behavioral: Negative for agitation and behavioral problems.  All other systems reviewed and are negative.   Per HPI unless specifically indicated above     Objective:    BP 128/76   Pulse 64   Temp (!) 97.1 F (36.2 C) (Oral)   Ht 5\' 4"  (1.626 m)   Wt 160 lb (72.6 kg)   BMI 27.46 kg/m   Wt Readings from Last 3 Encounters:  05/11/17 160 lb (72.6 kg)  08/25/14 154 lb (69.9 kg)  05/27/14 153 lb (69.4 kg)    Physical Exam  Constitutional: She is oriented to person, place, and time. She appears well-developed and well-nourished. No distress.  HENT:  Right Ear: Tympanic membrane, external ear and ear canal normal.  Left Ear: Tympanic membrane, external ear and ear canal normal.  Nose: Mucosal edema and rhinorrhea present. No epistaxis. Right sinus exhibits no maxillary sinus tenderness and no frontal sinus tenderness. Left sinus exhibits no maxillary sinus tenderness and no frontal sinus tenderness.  Mouth/Throat: Uvula is midline and mucous membranes are normal. Posterior oropharyngeal edema and posterior oropharyngeal erythema present. No oropharyngeal exudate or tonsillar abscesses.  Eyes: Conjunctivae are normal.  Neck: Neck supple. No thyromegaly present.  Cardiovascular: Normal rate, regular rhythm, normal heart sounds and intact  distal pulses.   No murmur heard. Pulmonary/Chest: Effort normal and breath sounds normal. No respiratory distress. She has no wheezes.  Musculoskeletal: Normal range of motion. She exhibits no edema.       Cervical back: She exhibits tenderness (Bilateral neck muscular tenderness in midline tenderness) and bony tenderness. She exhibits normal range of motion, no swelling, no deformity and no laceration.   Lymphadenopathy:    She has no cervical adenopathy.  Neurological: She is alert and oriented to person, place, and time. Coordination normal.  Skin: Skin is warm and dry. No rash noted. She is not diaphoretic.  Psychiatric: She has a normal mood and affect. Her behavior is normal.  Nursing note and vitals reviewed.       Assessment & Plan:   Problem List Items Addressed This Visit      Respiratory   Sinusitis, acute - Primary   Relevant Medications   fluticasone (FLONASE) 50 MCG/ACT nasal spray   azithromycin (ZITHROMAX) 250 MG tablet    Other Visit Diagnoses    Neck pain       Relevant Orders   Ambulatory referral to Physical Therapy   Cervical disc herniation       Relevant Orders   Ambulatory referral to Physical Therapy       Follow up plan: Return if symptoms worsen or fail to improve.  Counseling provided for all of the vaccine components No orders of the defined types were placed in this encounter.   Caryl Pina, MD [facility] 05/11/2017, 1:51 PM

## 2017-05-16 ENCOUNTER — Ambulatory Visit (INDEPENDENT_AMBULATORY_CARE_PROVIDER_SITE_OTHER): Payer: Medicare Other | Admitting: Family Medicine

## 2017-05-16 ENCOUNTER — Encounter: Payer: Self-pay | Admitting: Family Medicine

## 2017-05-16 ENCOUNTER — Ambulatory Visit: Payer: Medicare Other | Admitting: Family Medicine

## 2017-05-16 VITALS — BP 138/77 | HR 86 | Temp 98.1°F | Ht 64.0 in | Wt 159.0 lb

## 2017-05-16 DIAGNOSIS — J0101 Acute recurrent maxillary sinusitis: Secondary | ICD-10-CM | POA: Diagnosis not present

## 2017-05-16 MED ORDER — AMOXICILLIN 500 MG PO TABS: 500.0000 mg | ORAL_TABLET | Freq: Two times a day (BID) | ORAL | 0 refills | Status: DC

## 2017-05-16 NOTE — Progress Notes (Signed)
BP 138/77   Pulse 86   Temp 98.1 F (36.7 C) (Oral)   Ht 5\' 4"  (1.626 m)   Wt 159 lb (72.1 kg)   BMI 27.29 kg/m    Subjective:    Patient ID: Melanie Todd, female    DOB: 04/30/1941, 76 y.o.   MRN: [id number]  HPI: Melanie Todd is a 76 y.o. female presenting on 05/16/2017 for Sinusitis (still having sinus drainage, congestion; finished z-pak)   HPI Continued sinus drainage and pressure and worsening. Patient comes in again for continued sinus pressure and drainage that has been worsening and is now more located on her left frontal side. She is been still getting a lot of drainage at night and a sore throat. She's been using Flonase and Mucinex still and finished her azithromycin and it is just not clearing up. Her husband has the same illness but his seems to be clearing while hers does not. She says she's had it like this before once when it isn't clear and she took amoxicillin and the cleared for. She denies any fevers or chills or shortness of breath or wheezing. The sinus pressure is been significant and keeping her up at night.  Relevant past medical, surgical, family and social history reviewed and updated as indicated. Interim medical history since our last visit reviewed. Allergies and medications reviewed and updated.  Review of Systems  Constitutional: Negative for chills and fever.  HENT: Positive for congestion, postnasal drip, rhinorrhea, sinus pressure and sore throat. Negative for ear discharge, ear pain and sneezing.   Eyes: Negative for pain, redness and visual disturbance.  Respiratory: Positive for cough. Negative for chest tightness and shortness of breath.   Cardiovascular: Negative for chest pain and leg swelling.  Genitourinary: Negative for difficulty urinating and dysuria.  Musculoskeletal: Negative for back pain and gait problem.  Skin: Negative for rash.  Neurological: Negative for light-headedness and headaches.  Psychiatric/Behavioral: Negative for  agitation and behavioral problems.  All other systems reviewed and are negative.   Per HPI unless specifically indicated above        Objective:    BP 138/77   Pulse 86   Temp 98.1 F (36.7 C) (Oral)   Ht 5\' 4"  (1.626 m)   Wt 159 lb (72.1 kg)   BMI 27.29 kg/m   Wt Readings from Last 3 Encounters:  05/16/17 159 lb (72.1 kg)  05/11/17 160 lb (72.6 kg)  08/25/14 154 lb (69.9 kg)    Physical Exam  Constitutional: She is oriented to person, place, and time. She appears well-developed and well-nourished. No distress.  HENT:  Right Ear: Tympanic membrane, external ear and ear canal normal.  Left Ear: Tympanic membrane, external ear and ear canal normal.  Nose: Mucosal edema and rhinorrhea present. No epistaxis. Right sinus exhibits no maxillary sinus tenderness and no frontal sinus tenderness. Left sinus exhibits frontal sinus tenderness. Left sinus exhibits no maxillary sinus tenderness.  Mouth/Throat: Uvula is midline and mucous membranes are normal. Posterior oropharyngeal edema and posterior oropharyngeal erythema present. No oropharyngeal exudate or tonsillar abscesses.  Eyes: Conjunctivae and EOM are normal.  Cardiovascular: Normal rate, regular rhythm, normal heart sounds and intact distal pulses.   No murmur heard. Pulmonary/Chest: Effort normal and breath sounds normal. No respiratory distress. She has no wheezes. She has no rales.  Musculoskeletal: Normal range of motion. She exhibits no edema or tenderness.  Neurological: She is alert and oriented to person, place, and time. Coordination normal.  Skin: Skin is  warm and dry. No rash noted. She is not diaphoretic.  Psychiatric: She has a normal mood and affect. Her behavior is normal.  Vitals reviewed.     Assessment & Plan:   Problem List Items Addressed This Visit      Respiratory   Sinusitis, acute - Primary   Relevant Medications   amoxicillin (AMOXIL) 500 MG tablet       Follow up plan: Return if symptoms  worsen or fail to improve.  Counseling provided for all of the vaccine components No orders of the defined types were placed in this encounter.   Caryl Pina, MD [facility] 05/16/2017, 11:51 AM

## 2017-05-22 ENCOUNTER — Other Ambulatory Visit: Payer: Self-pay | Admitting: Family Medicine

## 2017-05-22 DIAGNOSIS — M6281 Muscle weakness (generalized): Secondary | ICD-10-CM | POA: Diagnosis not present

## 2017-05-22 DIAGNOSIS — M542 Cervicalgia: Secondary | ICD-10-CM | POA: Diagnosis not present

## 2017-05-22 DIAGNOSIS — M2560 Stiffness of unspecified joint, not elsewhere classified: Secondary | ICD-10-CM | POA: Diagnosis not present

## 2017-05-22 DIAGNOSIS — M5021 Other cervical disc displacement,  high cervical region: Secondary | ICD-10-CM | POA: Diagnosis not present

## 2017-05-24 DIAGNOSIS — M5021 Other cervical disc displacement,  high cervical region: Secondary | ICD-10-CM | POA: Diagnosis not present

## 2017-05-24 DIAGNOSIS — M2560 Stiffness of unspecified joint, not elsewhere classified: Secondary | ICD-10-CM | POA: Diagnosis not present

## 2017-05-24 DIAGNOSIS — M6281 Muscle weakness (generalized): Secondary | ICD-10-CM | POA: Diagnosis not present

## 2017-05-24 DIAGNOSIS — M542 Cervicalgia: Secondary | ICD-10-CM | POA: Diagnosis not present

## 2017-05-29 DIAGNOSIS — M5021 Other cervical disc displacement,  high cervical region: Secondary | ICD-10-CM | POA: Diagnosis not present

## 2017-05-29 DIAGNOSIS — M2560 Stiffness of unspecified joint, not elsewhere classified: Secondary | ICD-10-CM | POA: Diagnosis not present

## 2017-05-29 DIAGNOSIS — M6281 Muscle weakness (generalized): Secondary | ICD-10-CM | POA: Diagnosis not present

## 2017-05-29 DIAGNOSIS — M542 Cervicalgia: Secondary | ICD-10-CM | POA: Diagnosis not present

## 2017-06-01 DIAGNOSIS — M5021 Other cervical disc displacement,  high cervical region: Secondary | ICD-10-CM | POA: Diagnosis not present

## 2017-06-01 DIAGNOSIS — M2560 Stiffness of unspecified joint, not elsewhere classified: Secondary | ICD-10-CM | POA: Diagnosis not present

## 2017-06-01 DIAGNOSIS — M6281 Muscle weakness (generalized): Secondary | ICD-10-CM | POA: Diagnosis not present

## 2017-06-01 DIAGNOSIS — M542 Cervicalgia: Secondary | ICD-10-CM | POA: Diagnosis not present

## 2017-06-04 DIAGNOSIS — M542 Cervicalgia: Secondary | ICD-10-CM | POA: Diagnosis not present

## 2017-06-04 DIAGNOSIS — M2560 Stiffness of unspecified joint, not elsewhere classified: Secondary | ICD-10-CM | POA: Diagnosis not present

## 2017-06-04 DIAGNOSIS — M5021 Other cervical disc displacement,  high cervical region: Secondary | ICD-10-CM | POA: Diagnosis not present

## 2017-06-04 DIAGNOSIS — M6281 Muscle weakness (generalized): Secondary | ICD-10-CM | POA: Diagnosis not present

## 2017-06-07 DIAGNOSIS — M542 Cervicalgia: Secondary | ICD-10-CM | POA: Diagnosis not present

## 2017-06-07 DIAGNOSIS — M2560 Stiffness of unspecified joint, not elsewhere classified: Secondary | ICD-10-CM | POA: Diagnosis not present

## 2017-06-07 DIAGNOSIS — M6281 Muscle weakness (generalized): Secondary | ICD-10-CM | POA: Diagnosis not present

## 2017-06-07 DIAGNOSIS — M5021 Other cervical disc displacement,  high cervical region: Secondary | ICD-10-CM | POA: Diagnosis not present

## 2017-06-12 DIAGNOSIS — M2560 Stiffness of unspecified joint, not elsewhere classified: Secondary | ICD-10-CM | POA: Diagnosis not present

## 2017-06-12 DIAGNOSIS — M542 Cervicalgia: Secondary | ICD-10-CM | POA: Diagnosis not present

## 2017-06-12 DIAGNOSIS — M6281 Muscle weakness (generalized): Secondary | ICD-10-CM | POA: Diagnosis not present

## 2017-06-12 DIAGNOSIS — M5021 Other cervical disc displacement,  high cervical region: Secondary | ICD-10-CM | POA: Diagnosis not present

## 2017-06-13 DIAGNOSIS — M2560 Stiffness of unspecified joint, not elsewhere classified: Secondary | ICD-10-CM | POA: Diagnosis not present

## 2017-06-13 DIAGNOSIS — M6281 Muscle weakness (generalized): Secondary | ICD-10-CM | POA: Diagnosis not present

## 2017-06-13 DIAGNOSIS — M5021 Other cervical disc displacement,  high cervical region: Secondary | ICD-10-CM | POA: Diagnosis not present

## 2017-06-13 DIAGNOSIS — M542 Cervicalgia: Secondary | ICD-10-CM | POA: Diagnosis not present

## 2017-06-19 DIAGNOSIS — M5021 Other cervical disc displacement,  high cervical region: Secondary | ICD-10-CM | POA: Diagnosis not present

## 2017-06-19 DIAGNOSIS — M2560 Stiffness of unspecified joint, not elsewhere classified: Secondary | ICD-10-CM | POA: Diagnosis not present

## 2017-06-19 DIAGNOSIS — M6281 Muscle weakness (generalized): Secondary | ICD-10-CM | POA: Diagnosis not present

## 2017-06-19 DIAGNOSIS — M542 Cervicalgia: Secondary | ICD-10-CM | POA: Diagnosis not present

## 2017-06-22 DIAGNOSIS — M6281 Muscle weakness (generalized): Secondary | ICD-10-CM | POA: Diagnosis not present

## 2017-06-22 DIAGNOSIS — M2560 Stiffness of unspecified joint, not elsewhere classified: Secondary | ICD-10-CM | POA: Diagnosis not present

## 2017-06-22 DIAGNOSIS — M542 Cervicalgia: Secondary | ICD-10-CM | POA: Diagnosis not present

## 2017-06-22 DIAGNOSIS — M5021 Other cervical disc displacement,  high cervical region: Secondary | ICD-10-CM | POA: Diagnosis not present

## 2017-06-27 DIAGNOSIS — M6281 Muscle weakness (generalized): Secondary | ICD-10-CM | POA: Diagnosis not present

## 2017-06-27 DIAGNOSIS — M2560 Stiffness of unspecified joint, not elsewhere classified: Secondary | ICD-10-CM | POA: Diagnosis not present

## 2017-06-27 DIAGNOSIS — M542 Cervicalgia: Secondary | ICD-10-CM | POA: Diagnosis not present

## 2017-06-27 DIAGNOSIS — M5021 Other cervical disc displacement,  high cervical region: Secondary | ICD-10-CM | POA: Diagnosis not present

## 2017-06-29 DIAGNOSIS — M2560 Stiffness of unspecified joint, not elsewhere classified: Secondary | ICD-10-CM | POA: Diagnosis not present

## 2017-06-29 DIAGNOSIS — M542 Cervicalgia: Secondary | ICD-10-CM | POA: Diagnosis not present

## 2017-06-29 DIAGNOSIS — M5021 Other cervical disc displacement,  high cervical region: Secondary | ICD-10-CM | POA: Diagnosis not present

## 2017-06-29 DIAGNOSIS — M6281 Muscle weakness (generalized): Secondary | ICD-10-CM | POA: Diagnosis not present

## 2017-07-01 DIAGNOSIS — Z23 Encounter for immunization: Secondary | ICD-10-CM | POA: Diagnosis not present

## 2017-07-03 ENCOUNTER — Encounter: Payer: Self-pay | Admitting: Family Medicine

## 2017-07-03 ENCOUNTER — Ambulatory Visit (INDEPENDENT_AMBULATORY_CARE_PROVIDER_SITE_OTHER): Payer: Medicare Other | Admitting: Family Medicine

## 2017-07-03 VITALS — BP 138/81 | HR 77 | Temp 97.4°F | Ht 64.0 in | Wt 160.0 lb

## 2017-07-03 DIAGNOSIS — S39012A Strain of muscle, fascia and tendon of lower back, initial encounter: Secondary | ICD-10-CM | POA: Diagnosis not present

## 2017-07-03 MED ORDER — BACLOFEN 10 MG PO TABS: 10.0000 mg | ORAL_TABLET | Freq: Three times a day (TID) | ORAL | 0 refills | Status: DC

## 2017-07-03 MED ORDER — METHYLPREDNISOLONE ACETATE 80 MG/ML IJ SUSP
80.0000 mg | Freq: Once | INTRAMUSCULAR | Status: AC
Start: 2017-07-03 — End: 2017-07-03
  Administered 2017-07-03: 80 mg via INTRAMUSCULAR

## 2017-07-03 NOTE — Progress Notes (Signed)
BP 138/81   Pulse 77   Temp (!) 97.4 F (36.3 C) (Oral)   Ht 5\' 4"  (1.626 m)   Wt 160 lb (72.6 kg)   BMI 27.46 kg/m    Subjective:    Patient ID: Melanie Todd, female    DOB: [birth year], 76 y.o.   MRN: [id number]  HPI: Melanie Todd is a 76 y.o. female presenting on 07/03/2017 for Muscle spasms in right hip   HPI Right lower back pain  Patient is coming in today with complaints of right lower back pain that is been hurting over the past 1 week but is worsened over the past couple days.  She feels like she is getting spasms on the posterior aspect of her right hip and lower back.  She says it does not go anywhere else and she does not feel like she has any numbness or weakness but that is been significant and hurting her from being able to get up and get down.  Pain is worse with twisting and getting up from a sitting position.  She has been using tramadol and heating pad which have helped but is not seeming to make it better.  Relevant past medical, surgical, family and social history reviewed and updated as indicated. Interim medical history since our last visit reviewed. Allergies and medications reviewed and updated.  Review of Systems  Constitutional: Negative for chills and fever.  Eyes: Negative for visual disturbance.  Respiratory: Negative for chest tightness and shortness of breath.   Cardiovascular: Negative for chest pain and leg swelling.  Musculoskeletal: Positive for arthralgias, back pain and myalgias. Negative for gait problem.  Skin: Negative for color change and rash.  Neurological: Negative for light-headedness and headaches.  Psychiatric/Behavioral: Negative for agitation and behavioral problems.  All other systems reviewed and are negative.   Per HPI unless specifically indicated above     Objective:    BP 138/81   Pulse 77   Temp (!) 97.4 F (36.3 C) (Oral)   Ht 5\' 4"  (1.626 m)   Wt 160 lb (72.6 kg)   BMI 27.46 kg/m   Wt Readings from Last 3  Encounters:  07/03/17 160 lb (72.6 kg)  05/16/17 159 lb (72.1 kg)  05/11/17 160 lb (72.6 kg)    Physical Exam  Constitutional: She is oriented to person, place, and time. She appears well-developed and well-nourished. No distress.  Eyes: Conjunctivae are normal.  Cardiovascular: Normal rate, regular rhythm, normal heart sounds and intact distal pulses.  No murmur heard. Pulmonary/Chest: Effort normal and breath sounds normal. No respiratory distress. She has no wheezes. She has no rales.  Musculoskeletal: Normal range of motion.       Lumbar back: She exhibits tenderness. She exhibits normal range of motion, no swelling and no deformity.       Back:  Neurological: She is alert and oriented to person, place, and time. Coordination normal.  Skin: Skin is warm and dry. No rash noted. She is not diaphoretic.  Psychiatric: She has a normal mood and affect. Her behavior is normal.  Nursing note and vitals reviewed.       Assessment & Plan:   Problem List Items Addressed This Visit    None    Visit Diagnoses    Lumbar strain, initial encounter    -  Primary   Relevant Medications   methylPREDNISolone acetate (DEPO-MEDROL) injection 80 mg (Start on 07/03/2017  3:30 PM)   baclofen (LIORESAL) 10 MG tablet  Follow up plan: Return if symptoms worsen or fail to improve.  Counseling provided for all of the vaccine components No orders of the defined types were placed in this encounter.   Caryl Pina, MD [facility] 07/03/2017, 3:19 PM

## 2017-07-09 ENCOUNTER — Ambulatory Visit: Payer: Medicare Other | Admitting: Family Medicine

## 2017-07-09 DIAGNOSIS — M47896 Other spondylosis, lumbar region: Secondary | ICD-10-CM | POA: Diagnosis not present

## 2017-07-09 DIAGNOSIS — M47816 Spondylosis without myelopathy or radiculopathy, lumbar region: Secondary | ICD-10-CM | POA: Diagnosis not present

## 2017-07-09 DIAGNOSIS — Z882 Allergy status to sulfonamides status: Secondary | ICD-10-CM | POA: Diagnosis not present

## 2017-07-09 DIAGNOSIS — K219 Gastro-esophageal reflux disease without esophagitis: Secondary | ICD-10-CM | POA: Diagnosis not present

## 2017-07-09 DIAGNOSIS — Z87891 Personal history of nicotine dependence: Secondary | ICD-10-CM | POA: Diagnosis not present

## 2017-07-09 DIAGNOSIS — M25551 Pain in right hip: Secondary | ICD-10-CM | POA: Diagnosis not present

## 2017-07-09 DIAGNOSIS — I10 Essential (primary) hypertension: Secondary | ICD-10-CM | POA: Diagnosis not present

## 2017-07-09 DIAGNOSIS — M85852 Other specified disorders of bone density and structure, left thigh: Secondary | ICD-10-CM | POA: Diagnosis not present

## 2017-07-09 DIAGNOSIS — M549 Dorsalgia, unspecified: Secondary | ICD-10-CM | POA: Diagnosis not present

## 2017-07-09 DIAGNOSIS — N3000 Acute cystitis without hematuria: Secondary | ICD-10-CM | POA: Diagnosis not present

## 2017-07-09 DIAGNOSIS — M160 Bilateral primary osteoarthritis of hip: Secondary | ICD-10-CM | POA: Diagnosis not present

## 2017-07-09 DIAGNOSIS — Z885 Allergy status to narcotic agent status: Secondary | ICD-10-CM | POA: Diagnosis not present

## 2017-07-09 DIAGNOSIS — Z79890 Hormone replacement therapy (postmenopausal): Secondary | ICD-10-CM | POA: Diagnosis not present

## 2017-07-09 DIAGNOSIS — M545 Low back pain: Secondary | ICD-10-CM | POA: Diagnosis not present

## 2017-07-09 DIAGNOSIS — M85851 Other specified disorders of bone density and structure, right thigh: Secondary | ICD-10-CM | POA: Diagnosis not present

## 2017-07-09 DIAGNOSIS — Z79899 Other long term (current) drug therapy: Secondary | ICD-10-CM | POA: Diagnosis not present

## 2017-07-18 DIAGNOSIS — M1611 Unilateral primary osteoarthritis, right hip: Secondary | ICD-10-CM | POA: Diagnosis not present

## 2017-07-18 DIAGNOSIS — M545 Low back pain: Secondary | ICD-10-CM | POA: Diagnosis not present

## 2017-07-18 DIAGNOSIS — M25551 Pain in right hip: Secondary | ICD-10-CM | POA: Diagnosis not present

## 2017-07-25 ENCOUNTER — Telehealth: Payer: Self-pay | Admitting: Family Medicine

## 2017-07-27 ENCOUNTER — Ambulatory Visit (INDEPENDENT_AMBULATORY_CARE_PROVIDER_SITE_OTHER): Payer: Medicare Other | Admitting: Family

## 2017-07-27 ENCOUNTER — Encounter: Payer: Self-pay | Admitting: Family

## 2017-07-27 VITALS — BP 132/74 | HR 76 | Temp 98.8°F | Ht 64.0 in | Wt 161.4 lb

## 2017-07-27 DIAGNOSIS — N898 Other specified noninflammatory disorders of vagina: Secondary | ICD-10-CM | POA: Diagnosis not present

## 2017-07-27 DIAGNOSIS — N3000 Acute cystitis without hematuria: Secondary | ICD-10-CM

## 2017-07-27 DIAGNOSIS — R300 Dysuria: Secondary | ICD-10-CM | POA: Diagnosis not present

## 2017-07-27 LAB — WET PREP FOR TRICH, YEAST, CLUE
Clue Cell Exam: NEGATIVE
Trichomonas Exam: NEGATIVE
Yeast Exam: NEGATIVE

## 2017-07-27 LAB — URINALYSIS, COMPLETE
Bilirubin, UA: NEGATIVE
GLUCOSE, UA: NEGATIVE
KETONES UA: NEGATIVE
NITRITE UA: NEGATIVE
PROTEIN UA: NEGATIVE
RBC, UA: NEGATIVE
SPEC GRAV UA: 1.015 (ref 1.005–1.030)
Urobilinogen, Ur: 0.2 mg/dL (ref 0.2–1.0)
pH, UA: 6 (ref 5.0–7.5)

## 2017-07-27 LAB — MICROSCOPIC EXAMINATION
RBC, UA: NONE SEEN /hpf (ref 0–?)
RENAL EPITHEL UA: NONE SEEN /HPF

## 2017-07-27 MED ORDER — NITROFURANTOIN MONOHYD MACRO 100 MG PO CAPS: 100.0000 mg | ORAL_CAPSULE | Freq: Two times a day (BID) | ORAL | 0 refills | Status: DC

## 2017-07-27 MED ORDER — ESTROGENS, CONJUGATED 0.625 MG/GM VA CREA: TOPICAL_CREAM | VAGINAL | 2 refills | Status: DC

## 2017-07-27 NOTE — Patient Instructions (Signed)

## 2017-07-27 NOTE — Addendum Note (Signed)
Addended by: Evelina Dun A on: 07/27/2017 12:06 PM   Modules accepted: Orders

## 2017-07-27 NOTE — Progress Notes (Signed)
   Subjective:    Patient ID: Melanie Todd, female    DOB: May 10, 1941, 76 y.o.   MRN: [id number]  Dysuria   This is a new problem. The current episode started in the past 7 days. The problem occurs intermittently. The problem has been gradually worsening. The quality of the pain is described as burning. The pain is at a severity of 8/10. The pain is moderate. There has been no fever. Associated symptoms include a discharge. Pertinent negatives include no chills, flank pain, frequency, hematuria, hesitancy, nausea or urgency. She has tried antibiotics for the symptoms. The treatment provided mild relief.      Review of Systems  Constitutional: Negative for chills.  Gastrointestinal: Negative for nausea.  Genitourinary: Positive for dysuria. Negative for flank pain, frequency, hematuria, hesitancy and urgency.  All other systems reviewed and are negative.      Objective:   Physical Exam  Constitutional: She is oriented to person, place, and time. She appears well-developed and well-nourished. No distress.  HENT:  Head: Normocephalic.  Eyes: Pupils are equal, round, and reactive to light.  Neck: Normal range of motion. Neck supple. No thyromegaly present.  Cardiovascular: Normal rate, regular rhythm, normal heart sounds and intact distal pulses.  No murmur heard. Pulmonary/Chest: Effort normal and breath sounds normal. No respiratory distress. She has no wheezes.  Abdominal: Soft. Bowel sounds are normal. She exhibits no distension. There is no tenderness.  Musculoskeletal: Normal range of motion. She exhibits no edema or tenderness.  Neurological: She is alert and oriented to person, place, and time.  Skin: Skin is warm and dry.  Psychiatric: She has a normal mood and affect. Her behavior is normal. Judgment and thought content normal.  Vitals reviewed.   BP 132/74   Pulse 76   Temp 98.8 F (37.1 C) (Oral)   Ht 5\' 4"  (1.626 m)   Wt 161 lb 6.4 oz (73.2 kg)   BMI 27.70 kg/m     Assessment & Plan:  1. Dysuria - Urinalysis, Complete - WET PREP FOR TRICH, YEAST, CLUE - Urine Culture - nitrofurantoin, macrocrystal-monohydrate, (MACROBID) 100 MG capsule; Take 1 capsule (100 mg total) by mouth 2 (two) times daily.  Dispense: 10 capsule; Refill: 0  2. Vaginal discharge - WET PREP FOR [place], YEAST, CLUE  3. Acute cystitis without hematuria Force fluids AZO over the counter X2 days RTO prn Culture pending - Urine Culture - nitrofurantoin, macrocrystal-monohydrate, (MACROBID) 100 MG capsule; Take 1 capsule (100 mg total) by mouth 2 (two) times daily.  Dispense: 10 capsule; Refill: 0    Evelina Dun, FNP

## 2017-07-30 LAB — URINE CULTURE

## 2017-08-15 ENCOUNTER — Other Ambulatory Visit: Payer: Self-pay | Admitting: Family Medicine

## 2017-08-20 DIAGNOSIS — M25551 Pain in right hip: Secondary | ICD-10-CM | POA: Diagnosis not present

## 2017-08-20 DIAGNOSIS — M1611 Unilateral primary osteoarthritis, right hip: Secondary | ICD-10-CM | POA: Diagnosis not present

## 2017-08-20 DIAGNOSIS — M7061 Trochanteric bursitis, right hip: Secondary | ICD-10-CM | POA: Diagnosis not present

## 2017-08-29 ENCOUNTER — Ambulatory Visit: Payer: Medicare Other | Admitting: Family Medicine

## 2017-08-30 ENCOUNTER — Encounter: Payer: Self-pay | Admitting: Family Medicine

## 2017-09-03 DIAGNOSIS — J069 Acute upper respiratory infection, unspecified: Secondary | ICD-10-CM | POA: Diagnosis not present

## 2017-09-07 DIAGNOSIS — N952 Postmenopausal atrophic vaginitis: Secondary | ICD-10-CM | POA: Diagnosis not present

## 2017-09-07 DIAGNOSIS — R300 Dysuria: Secondary | ICD-10-CM | POA: Diagnosis not present

## 2017-09-20 ENCOUNTER — Other Ambulatory Visit: Payer: Self-pay | Admitting: *Deleted

## 2017-09-24 ENCOUNTER — Other Ambulatory Visit: Payer: Self-pay | Admitting: Family Medicine

## 2017-09-25 ENCOUNTER — Ambulatory Visit (INDEPENDENT_AMBULATORY_CARE_PROVIDER_SITE_OTHER): Payer: Medicare Other | Admitting: Family Medicine

## 2017-09-25 ENCOUNTER — Encounter: Payer: Self-pay | Admitting: Family Medicine

## 2017-09-25 VITALS — BP 137/74 | HR 82 | Temp 98.1°F | Ht 64.0 in | Wt 159.0 lb

## 2017-09-25 DIAGNOSIS — J309 Allergic rhinitis, unspecified: Secondary | ICD-10-CM

## 2017-09-25 DIAGNOSIS — M5441 Lumbago with sciatica, right side: Secondary | ICD-10-CM

## 2017-09-25 MED ORDER — METHYLPREDNISOLONE ACETATE 80 MG/ML IJ SUSP
80.0000 mg | Freq: Once | INTRAMUSCULAR | Status: AC
  Administered 2017-09-25: 80 mg via INTRAMUSCULAR

## 2017-09-25 MED ORDER — TRAMADOL HCL 50 MG PO TABS: 50.0000 mg | ORAL_TABLET | Freq: Every day | ORAL | 2 refills | Status: DC

## 2017-09-25 NOTE — Progress Notes (Signed)
BP 137/74   Pulse 82   Temp 98.1 F (36.7 C) (Oral)   Ht 5\' 4"  (1.626 m)   Wt 159 lb (72.1 kg)   BMI 27.29 kg/m    Subjective:    Patient ID: Melanie Todd, female    DOB: 1940-10-01, 77 y.o.   MRN: [id number]  HPI: Melanie Todd is a 77 y.o. female presenting on 09/25/2017 for Left ear pain, radiates into side of neck, dizziness; Medication Refill (Tramadol); and Right hip pain   HPI Right low back and hip pain Patient has been having right low back and hip pain which is similar to what she had previously that is been going on about a week this time.  Patient has been having this pain before but it started up a week ago and she had a Depo-Medrol shot previously.  Left ear pain Has left ear pain and congestion that radiates into the side of her neck on that side and pressure into the side of her jaw as well.  She has been having some dizziness especially when she leans forward or gets up quickly.  She denies any fevers or chills or drainage but has been having a cough and some sore throat as well.  The sore throat and congestion is worse early in the morning.  She has been having this for a few weeks.  Relevant past medical, surgical, family and social history reviewed and updated as indicated. Interim medical history since our last visit reviewed. Allergies and medications reviewed and updated.  Review of Systems  Constitutional: Negative for chills and fever.  HENT: Positive for congestion, ear pain and sinus pressure. Negative for ear discharge, sinus pain and sore throat.   Eyes: Negative for visual disturbance.  Respiratory: Negative for cough, chest tightness and shortness of breath.   Cardiovascular: Negative for chest pain and leg swelling.  Musculoskeletal: Negative for back pain and gait problem.  Skin: Negative for rash.  Neurological: Negative for light-headedness and headaches.  Psychiatric/Behavioral: Negative for agitation and behavioral problems.  All other systems  reviewed and are negative.   Per HPI unless specifically indicated above   Allergies as of 09/25/2017      Reactions   Codeine    Sulfa Antibiotics       Medication List        Accurate as of 09/25/17  1:27 PM. Always use your most recent med list.          celecoxib 200 MG capsule Commonly known as:  CELEBREX Take 200 mg by mouth daily.   conjugated estrogens vaginal cream Commonly known as:  PREMARIN Use twice-three times weekly   omeprazole 20 MG capsule Commonly known as:  PRILOSEC 1 capsule daily   ONE DAILY MULTIVITAMIN WOMEN PO Take 1 capsule by mouth daily.   traMADol 50 MG tablet Commonly known as:  ULTRAM Take 1 tablet (50 mg total) by mouth daily.   Verapamil HCl CR 300 MG Cp24 TAKE 1 CAPSULE BY MOUTH EVERY DAY FOR BLOOD PRESSURE AS DIRECTED ORALLY 90 DAYS          Objective:    BP 137/74   Pulse 82   Temp 98.1 F (36.7 C) (Oral)   Ht 5\' 4"  (1.626 m)   Wt 159 lb (72.1 kg)   BMI 27.29 kg/m   Wt Readings from Last 3 Encounters:  09/25/17 159 lb (72.1 kg)  07/27/17 161 lb 6.4 oz (73.2 kg)  07/03/17 160 lb (72.6 kg)  Physical Exam  Constitutional: She is oriented to person, place, and time. She appears well-developed and well-nourished. No distress.  HENT:  Right Ear: Tympanic membrane, external ear and ear canal normal.  Left Ear: Tympanic membrane, external ear and ear canal normal.  Nose: Mucosal edema present. No rhinorrhea. No epistaxis. Right sinus exhibits maxillary sinus tenderness and frontal sinus tenderness. Left sinus exhibits maxillary sinus tenderness and frontal sinus tenderness.  Mouth/Throat: Uvula is midline and mucous membranes are normal. Posterior oropharyngeal edema present. No oropharyngeal exudate, posterior oropharyngeal erythema or tonsillar abscesses.  Eyes: Conjunctivae and EOM are normal.  Neck: Neck supple. No thyromegaly present.  Cardiovascular: Normal rate, regular rhythm, normal heart sounds and intact distal  pulses.  No murmur heard. Pulmonary/Chest: Effort normal and breath sounds normal. No respiratory distress. She has no wheezes. She has no rales.  Musculoskeletal: Normal range of motion.       Lumbar back: She exhibits tenderness. She exhibits normal range of motion, no bony tenderness and no swelling.       Back:  Lymphadenopathy:    She has no cervical adenopathy.  Neurological: She is alert and oriented to person, place, and time. Coordination normal.  Skin: Skin is warm and dry. No rash noted. She is not diaphoretic.  Psychiatric: She has a normal mood and affect. Her behavior is normal.  Nursing note and vitals reviewed.       Assessment & Plan:   Problem List Items Addressed This Visit    None    Visit Diagnoses    Allergic sinusitis    -  Primary   Relevant Medications   methylPREDNISolone acetate (DEPO-MEDROL) injection 80 mg (Completed)   Acute right-sided low back pain with right-sided sciatica       Relevant Medications   celecoxib (CELEBREX) 200 MG capsule   traMADol (ULTRAM) 50 MG tablet   methylPREDNISolone acetate (DEPO-MEDROL) injection 80 mg (Completed)       Follow up plan: Return if symptoms worsen or fail to improve.  Counseling provided for all of the vaccine components No orders of the defined types were placed in this encounter.   Caryl Pina, MD [facility] 09/25/2017, 1:27 PM

## 2017-10-21 DIAGNOSIS — G44219 Episodic tension-type headache, not intractable: Secondary | ICD-10-CM | POA: Diagnosis not present

## 2017-10-21 DIAGNOSIS — J0100 Acute maxillary sinusitis, unspecified: Secondary | ICD-10-CM | POA: Diagnosis not present

## 2017-11-14 ENCOUNTER — Other Ambulatory Visit: Payer: Self-pay | Admitting: Family Medicine

## 2017-12-15 ENCOUNTER — Ambulatory Visit: Payer: Medicare Other

## 2017-12-18 DIAGNOSIS — N952 Postmenopausal atrophic vaginitis: Secondary | ICD-10-CM | POA: Diagnosis not present

## 2017-12-20 ENCOUNTER — Other Ambulatory Visit: Payer: Self-pay | Admitting: Family Medicine

## 2017-12-25 ENCOUNTER — Encounter: Payer: Self-pay | Admitting: Family Medicine

## 2017-12-25 ENCOUNTER — Ambulatory Visit (INDEPENDENT_AMBULATORY_CARE_PROVIDER_SITE_OTHER): Payer: Medicare Other | Admitting: Family Medicine

## 2017-12-25 ENCOUNTER — Telehealth: Payer: Self-pay | Admitting: Family Medicine

## 2017-12-25 VITALS — BP 129/76 | HR 75 | Temp 97.5°F | Ht 64.0 in | Wt 160.0 lb

## 2017-12-25 DIAGNOSIS — R110 Nausea: Secondary | ICD-10-CM | POA: Diagnosis not present

## 2017-12-25 DIAGNOSIS — E782 Mixed hyperlipidemia: Secondary | ICD-10-CM | POA: Diagnosis not present

## 2017-12-25 DIAGNOSIS — R109 Unspecified abdominal pain: Secondary | ICD-10-CM | POA: Diagnosis not present

## 2017-12-25 DIAGNOSIS — Z79891 Long term (current) use of opiate analgesic: Secondary | ICD-10-CM | POA: Diagnosis not present

## 2017-12-25 MED ORDER — VERAPAMIL HCL ER 300 MG PO CP24: ORAL_CAPSULE | ORAL | 1 refills | Status: DC

## 2017-12-25 MED ORDER — ESTROGENS, CONJUGATED 0.625 MG/GM VA CREA: TOPICAL_CREAM | VAGINAL | 2 refills | Status: DC

## 2017-12-25 MED ORDER — PANTOPRAZOLE SODIUM 40 MG PO TBEC: 40.0000 mg | DELAYED_RELEASE_TABLET | Freq: Every day | ORAL | 1 refills | Status: DC

## 2017-12-25 MED ORDER — PANTOPRAZOLE SODIUM 40 MG PO TBEC: 40.0000 mg | DELAYED_RELEASE_TABLET | Freq: Every day | ORAL | 0 refills | Status: DC

## 2017-12-25 MED ORDER — BETAMETHASONE SOD PHOS & ACET 6 (3-3) MG/ML IJ SUSP
6.0000 mg | Freq: Once | INTRAMUSCULAR | Status: AC
  Administered 2017-12-25: 6 mg via INTRAMUSCULAR

## 2017-12-25 NOTE — Progress Notes (Signed)
Subjective:  Patient ID: Melanie Todd, female    DOB: [birth year]  Age: 77 y.o. MRN: [id number]  CC: Nausea (pt here today c/o being sick on Melanie Todd stomach for the past week or so)   HPI Melanie Todd presents for nausea and dizziness for two weeks. It has been stable. It is notgetting better. The dizziness is lightheadedness. No syncope.  Denies vertigo. There has been no vomiting. Appetite remains fair onnly. No diarrhea. No abdominal pain. Nausea is epigastric in location.   Depression screen [facility] 2/9 09/25/2017 07/27/2017 07/03/2017  Decreased Interest 0 0 0  Down, Depressed, Hopeless 0 0 0  PHQ - 2 Score 0 0 0    History Melanie Todd has a past medical history of GERD (gastroesophageal reflux disease) and Hypertension.   Melanie Todd has a past surgical history that includes Spine surgery; Hemorroidectomy; and Cholecystectomy.   Melanie Todd family history includes Cancer in Melanie Todd brother and father; Diabetes in Melanie Todd mother; Heart disease in Melanie Todd mother; Osteoporosis in Melanie Todd maternal grandmother and mother.Melanie Todd reports that Melanie Todd quit smoking about 14 years ago. Melanie Todd has never used smokeless tobacco. Melanie Todd reports that Melanie Todd does not drink alcohol or use drugs.    ROS Review of Systems  Constitutional: Negative.   HENT: Negative.  Negative for congestion.   Eyes: Negative for visual disturbance.  Respiratory: Negative for shortness of breath.   Cardiovascular: Negative for chest pain.  Gastrointestinal: Positive for nausea. Negative for abdominal pain, constipation, diarrhea and vomiting.  Genitourinary: Negative for difficulty urinating.  Musculoskeletal: Positive for neck pain (due to spondylosis in the neck. Requires tramadol for relief.). Negative for arthralgias and myalgias.  Neurological: Positive for headaches.  Psychiatric/Behavioral: Negative for sleep disturbance.    Objective:  BP 129/76   Pulse 75   Temp (!) 97.5 F (36.4 C) (Oral)   Ht '5\' 4"'$  (1.626 m)   Wt 160 lb (72.6 kg)   BMI 27.46 kg/m   BP  Readings from Last 3 Encounters:  12/25/17 129/76  09/25/17 137/74  07/27/17 132/74    Wt Readings from Last 3 Encounters:  12/25/17 160 lb (72.6 kg)  09/25/17 159 lb (72.1 kg)  07/27/17 161 lb 6.4 oz (73.2 kg)     Physical Exam  Constitutional: Melanie Todd is oriented to person, place, and time. Melanie Todd appears well-developed and well-nourished.  HENT:  Head: Normocephalic and atraumatic.  Cardiovascular: Normal rate and regular rhythm.  No murmur heard. Pulmonary/Chest: Effort normal and breath sounds normal.  Abdominal: Soft. Bowel sounds are normal. Melanie Todd exhibits no mass. There is tenderness. There is no rebound and no guarding.  Neurological: Melanie Todd is alert and oriented to person, place, and time.  Skin: Skin is warm and dry.  Psychiatric: Melanie Todd has a normal mood and affect. Melanie Todd behavior is normal.      Assessment & Plan:   Kosha was seen today for nausea.  Diagnoses and all orders for this visit:  Nausea -     ToxASSURE Select 13 (MW), Urine -     Amylase -     Lipase -     Sedimentation rate -     US Abdomen Complete; Future -     betamethasone acetate-betamethasone sodium phosphate (CELESTONE) injection 6 mg  Mixed hyperlipidemia -     CBC with Differential/Platelet -     CMP14+EGFR -     Lipid panel  Other orders -     conjugated estrogens (PREMARIN) vaginal cream; Use twice-three times weekly -  Verapamil HCl CR 300 MG CP24; TAKE 1 CAPSULE BY MOUTH EVERY DAY FOR BLOOD PRESSURE AS DIRECTED ORALLY 90 DAYS -     pantoprazole (PROTONIX) 40 MG tablet; Take 1 tablet (40 mg total) by mouth daily. -     pantoprazole (PROTONIX) 40 MG tablet; Take 1 tablet (40 mg total) by mouth daily. -     traMADol (ULTRAM) 50 MG tablet; Take 1 tablet (50 mg total) by mouth daily.       Melanie Todd omeprazole and celecoxib. Melanie am also having Melanie Todd start on pantoprazole and pantoprazole. Additionally, Melanie am having Melanie Todd maintain Melanie Todd Multiple Vitamins-Minerals (ONE DAILY  MULTIVITAMIN WOMEN PO), conjugated estrogens, Verapamil HCl CR, and traMADol. We administered betamethasone acetate-betamethasone sodium phosphate.  Allergies as of 12/25/2017      Reactions   Codeine    Sulfa Antibiotics       Medication List        Accurate as of 12/25/17 11:59 PM. Always use your most recent med list.          conjugated estrogens vaginal cream Commonly known as:  PREMARIN Use twice-three times weekly   ONE DAILY MULTIVITAMIN WOMEN PO Take 1 capsule by mouth daily.   pantoprazole 40 MG tablet Commonly known as:  PROTONIX Take 1 tablet (40 mg total) by mouth daily.   pantoprazole 40 MG tablet Commonly known as:  PROTONIX Take 1 tablet (40 mg total) by mouth daily.   traMADol 50 MG tablet Commonly known as:  ULTRAM Take 1 tablet (50 mg total) by mouth daily.   Verapamil HCl CR 300 MG Cp24 TAKE 1 CAPSULE BY MOUTH EVERY DAY FOR BLOOD PRESSURE AS DIRECTED ORALLY 90 DAYS        Follow-up: Return in about 3 months (around 03/27/2018).  Claretta Fraise, M.D.

## 2017-12-26 ENCOUNTER — Encounter: Payer: Self-pay | Admitting: Family Medicine

## 2017-12-26 LAB — CBC WITH DIFFERENTIAL/PLATELET
BASOS: 0 %
Basophils Absolute: 0 10*3/uL (ref 0.0–0.2)
EOS (ABSOLUTE): 0.1 10*3/uL (ref 0.0–0.4)
EOS: 1 %
HEMATOCRIT: 41.5 % (ref 34.0–46.6)
Hemoglobin: 14.2 g/dL (ref 11.1–15.9)
IMMATURE GRANS (ABS): 0 10*3/uL (ref 0.0–0.1)
IMMATURE GRANULOCYTES: 0 %
Lymphocytes Absolute: 2.4 10*3/uL (ref 0.7–3.1)
Lymphs: 29 %
MCH: 29.6 pg (ref 26.6–33.0)
MCHC: 34.2 g/dL (ref 31.5–35.7)
MCV: 87 fL (ref 79–97)
MONOS ABS: 0.6 10*3/uL (ref 0.1–0.9)
Monocytes: 7 %
NEUTROS PCT: 63 %
Neutrophils Absolute: 5.3 10*3/uL (ref 1.4–7.0)
PLATELETS: 229 10*3/uL (ref 150–379)
RBC: 4.79 x10E6/uL (ref 3.77–5.28)
RDW: 14.7 % (ref 12.3–15.4)
WBC: 8.4 10*3/uL (ref 3.4–10.8)

## 2017-12-26 LAB — LIPID PANEL
CHOLESTEROL TOTAL: 143 mg/dL (ref 100–199)
Chol/HDL Ratio: 2.9 ratio (ref 0.0–4.4)
HDL: 49 mg/dL (ref >39)
LDL CALC: 58 mg/dL (ref 0–99)
TRIGLYCERIDES: 181 mg/dL — AB (ref 0–149)
VLDL Cholesterol Cal: 36 mg/dL (ref 5–40)

## 2017-12-26 LAB — CMP14+EGFR
ALT: 43 IU/L — AB (ref 0–32)
AST: 34 IU/L (ref 0–40)
Albumin/Globulin Ratio: 1.3 (ref 1.2–2.2)
Albumin: 4.2 g/dL (ref 3.5–4.8)
Alkaline Phosphatase: 58 IU/L (ref 39–117)
BUN/Creatinine Ratio: 20 (ref 12–28)
BUN: 17 mg/dL (ref 8–27)
Bilirubin Total: 0.3 mg/dL (ref 0.0–1.2)
CALCIUM: 10 mg/dL (ref 8.7–10.3)
CHLORIDE: 101 mmol/L (ref 96–106)
CO2: 23 mmol/L (ref 20–29)
CREATININE: 0.85 mg/dL (ref 0.57–1.00)
GFR calc Af Amer: 76 mL/min/{1.73_m2} (ref >59)
GFR calc non Af Amer: 66 mL/min/{1.73_m2} (ref >59)
Globulin, Total: 3.2 g/dL (ref 1.5–4.5)
Glucose: 104 mg/dL — ABNORMAL HIGH (ref 65–99)
Potassium: 4.4 mmol/L (ref 3.5–5.2)
SODIUM: 142 mmol/L (ref 134–144)
TOTAL PROTEIN: 7.4 g/dL (ref 6.0–8.5)

## 2017-12-26 LAB — LIPASE: LIPASE: 29 U/L (ref 14–85)

## 2017-12-26 LAB — AMYLASE: Amylase: 107 U/L (ref 31–124)

## 2017-12-26 LAB — SEDIMENTATION RATE: SED RATE: 6 mm/h (ref 0–40)

## 2017-12-26 MED ORDER — TRAMADOL HCL 50 MG PO TABS: 50.0000 mg | ORAL_TABLET | Freq: Every day | ORAL | 1 refills | Status: DC

## 2017-12-27 DIAGNOSIS — K640 First degree hemorrhoids: Secondary | ICD-10-CM | POA: Diagnosis not present

## 2017-12-27 DIAGNOSIS — Z09 Encounter for follow-up examination after completed treatment for conditions other than malignant neoplasm: Secondary | ICD-10-CM | POA: Diagnosis not present

## 2017-12-27 DIAGNOSIS — D013 Carcinoma in situ of anus and anal canal: Secondary | ICD-10-CM | POA: Diagnosis not present

## 2017-12-28 ENCOUNTER — Other Ambulatory Visit: Payer: Self-pay | Admitting: *Deleted

## 2017-12-28 MED ORDER — ESTROGENS, CONJUGATED 0.625 MG/GM VA CREA: TOPICAL_CREAM | VAGINAL | 2 refills | Status: DC

## 2017-12-28 NOTE — Telephone Encounter (Signed)
Spoke to pt and advised Tramadol would be e-scribed to [place].

## 2017-12-28 NOTE — Telephone Encounter (Signed)
How many grams of premarin would you like for patient to insert each dose? Pharmacy called for clarification.

## 2017-12-29 LAB — TOXASSURE SELECT 13 (MW), URINE

## 2017-12-31 DIAGNOSIS — G44219 Episodic tension-type headache, not intractable: Secondary | ICD-10-CM | POA: Diagnosis not present

## 2017-12-31 DIAGNOSIS — J00 Acute nasopharyngitis [common cold]: Secondary | ICD-10-CM | POA: Diagnosis not present

## 2017-12-31 DIAGNOSIS — R110 Nausea: Secondary | ICD-10-CM | POA: Diagnosis not present

## 2017-12-31 DIAGNOSIS — G4709 Other insomnia: Secondary | ICD-10-CM | POA: Diagnosis not present

## 2017-12-31 DIAGNOSIS — R42 Dizziness and giddiness: Secondary | ICD-10-CM | POA: Diagnosis not present

## 2018-01-01 ENCOUNTER — Other Ambulatory Visit: Payer: Self-pay | Admitting: Family Medicine

## 2018-01-01 ENCOUNTER — Ambulatory Visit (HOSPITAL_COMMUNITY)
Admission: RE | Admit: 2018-01-01 | Discharge: 2018-01-01 | Disposition: A | Discharge status: Home / Self Care | Payer: Medicare Other | Source: Ambulatory Visit | Attending: Family Medicine | Admitting: Family Medicine

## 2018-01-01 DIAGNOSIS — Z9049 Acquired absence of other specified parts of digestive tract: Secondary | ICD-10-CM | POA: Insufficient documentation

## 2018-01-01 DIAGNOSIS — R109 Unspecified abdominal pain: Secondary | ICD-10-CM | POA: Diagnosis not present

## 2018-01-01 DIAGNOSIS — R110 Nausea: Secondary | ICD-10-CM | POA: Diagnosis not present

## 2018-01-01 MED ORDER — ESTROGENS, CONJUGATED 0.625 MG/GM VA CREA: TOPICAL_CREAM | VAGINAL | 3 refills | Status: DC

## 2018-01-02 ENCOUNTER — Telehealth: Payer: Self-pay | Admitting: Family Medicine

## 2018-01-02 NOTE — Telephone Encounter (Signed)
Tramadol script was printed and ready for patient.  Patient number stays busy signal.

## 2018-01-03 ENCOUNTER — Telehealth: Payer: Self-pay | Admitting: Family Medicine

## 2018-01-03 NOTE — Telephone Encounter (Signed)
RX faxed per pt request Pt notified

## 2018-01-16 ENCOUNTER — Telehealth: Payer: Self-pay | Admitting: Family Medicine

## 2018-01-16 ENCOUNTER — Other Ambulatory Visit: Payer: Self-pay | Admitting: Family Medicine

## 2018-01-16 MED ORDER — CELECOXIB 200 MG PO CAPS: 200.0000 mg | ORAL_CAPSULE | Freq: Two times a day (BID) | ORAL | 5 refills | Status: DC

## 2018-01-16 NOTE — Telephone Encounter (Signed)
Rx sent to [facility] and pt is aware of MD feedback and voiced understanding.

## 2018-01-16 NOTE — Telephone Encounter (Signed)
Patient requesting that Celebrex be sent to the [facility] mail order but looks like it was DC'd at last visit. Please review and advise

## 2018-01-16 NOTE — Telephone Encounter (Signed)
I believe you discontinued the Celebrex when she saw you last. Please advise?

## 2018-01-16 NOTE — Telephone Encounter (Signed)
I am concerned celebrex was contributing to heartburn. However, since she is now on a regimen of pantoprazole, it may not cause the symptoms. She can try it as long as she is aware to watch for signs of reflux and possible GI bleed.

## 2018-01-25 ENCOUNTER — Ambulatory Visit (INDEPENDENT_AMBULATORY_CARE_PROVIDER_SITE_OTHER): Payer: Medicare Other | Admitting: Family Medicine

## 2018-01-25 ENCOUNTER — Encounter: Payer: Self-pay | Admitting: Family Medicine

## 2018-01-25 VITALS — BP 130/71 | HR 73 | Temp 98.4°F | Ht 64.0 in | Wt 159.1 lb

## 2018-01-25 DIAGNOSIS — M5020 Other cervical disc displacement, unspecified cervical region: Secondary | ICD-10-CM | POA: Diagnosis not present

## 2018-01-25 MED ORDER — HYDROXYZINE HCL 25 MG PO TABS: 25.0000 mg | ORAL_TABLET | Freq: Every day | ORAL | 2 refills | Status: DC

## 2018-01-25 NOTE — Progress Notes (Signed)
Subjective:  Patient ID: Melanie Todd, female    DOB: 11-20-1940  Age: 77 y.o. MRN: [id number]  CC: Follow-up (pt here today for 3 week f/u)   HPI Melanie Todd presents for resolution of nausea. Much better. However she has chronic HA which has been treated with tramadol long-term. Brought on by herniated cervical disc.  Depression screen [facility] 2/9 01/25/2018 09/25/2017 07/27/2017  Decreased Interest 0 0 0  Down, Depressed, Hopeless 0 0 0  PHQ - 2 Score 0 0 0    History Melanie Todd has a past medical history of GERD (gastroesophageal reflux disease) and Hypertension.   She has a past surgical history that includes Spine surgery; Hemorroidectomy; and Cholecystectomy.   Her family history includes Cancer in her brother and father; Diabetes in her mother; Heart disease in her mother; Osteoporosis in her maternal grandmother and mother.She reports that she quit smoking about 14 years ago. She has never used smokeless tobacco. She reports that she does not drink alcohol or use drugs.    ROS Review of Systems  Constitutional: Negative.   HENT: Negative.   Eyes: Negative for visual disturbance.  Respiratory: Negative for shortness of breath.   Cardiovascular: Negative for chest pain.  Gastrointestinal: Negative for abdominal pain.  Musculoskeletal: Negative for arthralgias.  Neurological: Positive for headaches.    Objective:  BP 130/71   Pulse 73   Temp 98.4 F (36.9 C) (Oral)   Ht 5\' 4"  (1.626 m)   Wt 159 lb 2 oz (72.2 kg)   BMI 27.31 kg/m   BP Readings from Last 3 Encounters:  01/25/18 130/71  12/25/17 129/76  09/25/17 137/74    Wt Readings from Last 3 Encounters:  01/25/18 159 lb 2 oz (72.2 kg)  12/25/17 160 lb (72.6 kg)  09/25/17 159 lb (72.1 kg)     Physical Exam  Constitutional: She is oriented to person, place, and time. She appears well-developed and well-nourished. No distress.  HENT:  Head: Normocephalic and atraumatic.  Eyes: Pupils are equal, round, and reactive  to light. Conjunctivae are normal.  Neck: Normal range of motion. Neck supple. No thyromegaly present.  Cardiovascular: Normal rate, regular rhythm and normal heart sounds.  No murmur heard. Pulmonary/Chest: Effort normal and breath sounds normal. No respiratory distress. She has no wheezes. She has no rales.  Abdominal: Soft. Bowel sounds are normal. She exhibits no distension. There is no tenderness.  Musculoskeletal: Normal range of motion.  Lymphadenopathy:    She has no cervical adenopathy.  Neurological: She is alert and oriented to person, place, and time.  Skin: Skin is warm and dry.  Psychiatric: She has a normal mood and affect. Her behavior is normal. Judgment and thought content normal.      Assessment & Plan:   Melanie Todd was seen today for follow-up.  Diagnoses and all orders for this visit:  Cervical disc herniation  Other orders -     hydrOXYzine (ATARAX/VISTARIL) 25 MG tablet; Take 1-2 tablets (25-50 mg total) by mouth at bedtime.       I am having Melanie Todd start on hydrOXYzine. I am also having her maintain her Multiple Vitamins-Minerals (ONE DAILY MULTIVITAMIN WOMEN PO), Verapamil HCl CR, pantoprazole, traMADol, conjugated estrogens, and celecoxib.  Allergies as of 01/25/2018      Reactions   Codeine    Sulfa Antibiotics       Medication List        Accurate as of 01/25/18 11:59 PM. Always use your most recent med  list.          celecoxib 200 MG capsule Commonly known as:  CELEBREX Take 1 capsule (200 mg total) by mouth 2 (two) times daily.   conjugated estrogens vaginal cream Commonly known as:  PREMARIN Use 1 gram vaginally 3 times weekly   hydrOXYzine 25 MG tablet Commonly known as:  ATARAX/VISTARIL Take 1-2 tablets (25-50 mg total) by mouth at bedtime.   ONE DAILY MULTIVITAMIN WOMEN PO Take 1 capsule by mouth daily.   pantoprazole 40 MG tablet Commonly known as:  PROTONIX Take 1 tablet (40 mg total) by mouth daily.   traMADol 50 MG  tablet Commonly known as:  ULTRAM Take 1 tablet (50 mg total) by mouth daily.   Verapamil HCl CR 300 MG Cp24 TAKE 1 CAPSULE BY MOUTH EVERY DAY FOR BLOOD PRESSURE AS DIRECTED ORALLY 90 DAYS        Follow-up: Return if symptoms worsen or fail to improve.  Claretta Fraise, M.D.

## 2018-01-30 DIAGNOSIS — R300 Dysuria: Secondary | ICD-10-CM | POA: Diagnosis not present

## 2018-02-03 ENCOUNTER — Encounter: Payer: Self-pay | Admitting: Family Medicine

## 2018-02-16 ENCOUNTER — Other Ambulatory Visit: Payer: Self-pay | Admitting: Family Medicine

## 2018-02-28 DIAGNOSIS — H2513 Age-related nuclear cataract, bilateral: Secondary | ICD-10-CM | POA: Diagnosis not present

## 2018-02-28 DIAGNOSIS — H40013 Open angle with borderline findings, low risk, bilateral: Secondary | ICD-10-CM | POA: Diagnosis not present

## 2018-03-18 ENCOUNTER — Ambulatory Visit: Payer: Medicare Other | Admitting: *Deleted

## 2018-04-15 ENCOUNTER — Other Ambulatory Visit: Payer: Self-pay | Admitting: Family Medicine

## 2018-04-15 MED ORDER — PANTOPRAZOLE SODIUM 40 MG PO TBEC: 40.0000 mg | DELAYED_RELEASE_TABLET | Freq: Every day | ORAL | 1 refills | Status: DC

## 2018-04-15 MED ORDER — TRAMADOL HCL 50 MG PO TABS: 50.0000 mg | ORAL_TABLET | Freq: Every day | ORAL | 1 refills | Status: DC

## 2018-04-15 MED ORDER — VERAPAMIL HCL ER 300 MG PO CP24: ORAL_CAPSULE | ORAL | 0 refills | Status: DC

## 2018-04-15 MED ORDER — CELECOXIB 200 MG PO CAPS: 200.0000 mg | ORAL_CAPSULE | Freq: Two times a day (BID) | ORAL | 0 refills | Status: DC

## 2018-04-15 NOTE — Telephone Encounter (Signed)
See below, please advise.  I have set up everything except the Tramadol.

## 2018-04-15 NOTE — Telephone Encounter (Signed)
What is the name of the medication? Verapamil HCl CR 300 MG CP24 celecoxib (CELEBREX) 200 MG capsule pantoprazole (PROTONIX) 40 MG tablet traMADol (ULTRAM) 50 MG tablet  Have you contacted your pharmacy to request a refill? no  Which pharmacy would you like this sent to? [facility] mail order   Patient notified that their request is being sent to the clinical staff for review and that they should receive a call once it is complete. If they do not receive a call within 24 hours they can check with their pharmacy or our office.

## 2018-04-17 ENCOUNTER — Other Ambulatory Visit: Payer: Self-pay | Admitting: Family Medicine

## 2018-04-17 ENCOUNTER — Telehealth: Payer: Self-pay | Admitting: Family Medicine

## 2018-04-18 MED ORDER — VERAPAMIL HCL ER 300 MG PO CP24: ORAL_CAPSULE | ORAL | 0 refills | Status: DC

## 2018-04-18 MED ORDER — CELECOXIB 200 MG PO CAPS: 200.0000 mg | ORAL_CAPSULE | Freq: Two times a day (BID) | ORAL | 0 refills | Status: DC

## 2018-04-18 MED ORDER — PANTOPRAZOLE SODIUM 40 MG PO TBEC: 40.0000 mg | DELAYED_RELEASE_TABLET | Freq: Every day | ORAL | 1 refills | Status: DC

## 2018-04-18 NOTE — Telephone Encounter (Signed)
Refills resent to [facility], they were sent to [place]

## 2018-04-19 NOTE — Telephone Encounter (Signed)
Okay to refill all meds for 6 mos 

## 2018-05-04 DIAGNOSIS — Z882 Allergy status to sulfonamides status: Secondary | ICD-10-CM | POA: Diagnosis not present

## 2018-05-04 DIAGNOSIS — Z79899 Other long term (current) drug therapy: Secondary | ICD-10-CM | POA: Diagnosis not present

## 2018-05-04 DIAGNOSIS — M542 Cervicalgia: Secondary | ICD-10-CM | POA: Diagnosis not present

## 2018-05-04 DIAGNOSIS — Z886 Allergy status to analgesic agent status: Secondary | ICD-10-CM | POA: Diagnosis not present

## 2018-05-04 DIAGNOSIS — R202 Paresthesia of skin: Secondary | ICD-10-CM | POA: Diagnosis not present

## 2018-05-04 DIAGNOSIS — M47892 Other spondylosis, cervical region: Secondary | ICD-10-CM | POA: Diagnosis not present

## 2018-05-04 DIAGNOSIS — M5030 Other cervical disc degeneration, unspecified cervical region: Secondary | ICD-10-CM | POA: Diagnosis not present

## 2018-05-04 DIAGNOSIS — Z859 Personal history of malignant neoplasm, unspecified: Secondary | ICD-10-CM | POA: Diagnosis not present

## 2018-05-04 DIAGNOSIS — Z87891 Personal history of nicotine dependence: Secondary | ICD-10-CM | POA: Diagnosis not present

## 2018-05-04 DIAGNOSIS — I10 Essential (primary) hypertension: Secondary | ICD-10-CM | POA: Diagnosis not present

## 2018-05-04 DIAGNOSIS — K219 Gastro-esophageal reflux disease without esophagitis: Secondary | ICD-10-CM | POA: Diagnosis not present

## 2018-05-04 DIAGNOSIS — M4802 Spinal stenosis, cervical region: Secondary | ICD-10-CM | POA: Diagnosis not present

## 2018-05-04 DIAGNOSIS — M5021 Other cervical disc displacement,  high cervical region: Secondary | ICD-10-CM | POA: Diagnosis not present

## 2018-05-15 ENCOUNTER — Ambulatory Visit (INDEPENDENT_AMBULATORY_CARE_PROVIDER_SITE_OTHER): Payer: Medicare Other | Admitting: Family Medicine

## 2018-05-15 ENCOUNTER — Encounter: Payer: Self-pay | Admitting: Family Medicine

## 2018-05-15 VITALS — BP 137/78 | HR 74 | Temp 97.6°F | Ht 64.0 in | Wt 156.4 lb

## 2018-05-15 DIAGNOSIS — M5020 Other cervical disc displacement, unspecified cervical region: Secondary | ICD-10-CM

## 2018-05-15 DIAGNOSIS — Z0289 Encounter for other administrative examinations: Secondary | ICD-10-CM

## 2018-05-15 DIAGNOSIS — I10 Essential (primary) hypertension: Secondary | ICD-10-CM

## 2018-05-15 DIAGNOSIS — J0110 Acute frontal sinusitis, unspecified: Secondary | ICD-10-CM

## 2018-05-15 DIAGNOSIS — Z79891 Long term (current) use of opiate analgesic: Secondary | ICD-10-CM | POA: Diagnosis not present

## 2018-05-15 LAB — CMP14+EGFR
A/G RATIO: 1.6 (ref 1.2–2.2)
ALT: 33 IU/L — AB (ref 0–32)
AST: 35 IU/L (ref 0–40)
Albumin: 4.2 g/dL (ref 3.5–4.8)
Alkaline Phosphatase: 58 IU/L (ref 39–117)
BILIRUBIN TOTAL: 0.3 mg/dL (ref 0.0–1.2)
BUN/Creatinine Ratio: 15 (ref 12–28)
BUN: 13 mg/dL (ref 8–27)
CHLORIDE: 101 mmol/L (ref 96–106)
CO2: 24 mmol/L (ref 20–29)
Calcium: 9.7 mg/dL (ref 8.7–10.3)
Creatinine, Ser: 0.86 mg/dL (ref 0.57–1.00)
GFR calc non Af Amer: 65 mL/min/{1.73_m2} (ref >59)
GFR, EST AFRICAN AMERICAN: 75 mL/min/{1.73_m2} (ref >59)
Globulin, Total: 2.7 g/dL (ref 1.5–4.5)
Glucose: 101 mg/dL — ABNORMAL HIGH (ref 65–99)
POTASSIUM: 4.4 mmol/L (ref 3.5–5.2)
Sodium: 142 mmol/L (ref 134–144)
TOTAL PROTEIN: 6.9 g/dL (ref 6.0–8.5)

## 2018-05-15 MED ORDER — ESTROGENS, CONJUGATED 0.625 MG/GM VA CREA: TOPICAL_CREAM | VAGINAL | 3 refills | Status: DC

## 2018-05-15 MED ORDER — TRAMADOL HCL 50 MG PO TABS: 50.0000 mg | ORAL_TABLET | Freq: Every day | ORAL | 2 refills | Status: DC

## 2018-05-15 MED ORDER — PREDNISONE 10 MG PO TABS: ORAL_TABLET | ORAL | 0 refills | Status: DC

## 2018-05-15 MED ORDER — CELECOXIB 200 MG PO CAPS: 200.0000 mg | ORAL_CAPSULE | Freq: Two times a day (BID) | ORAL | 1 refills | Status: DC

## 2018-05-15 MED ORDER — AMOXICILLIN-POT CLAVULANATE 875-125 MG PO TABS: 1.0000 | ORAL_TABLET | Freq: Two times a day (BID) | ORAL | 0 refills | Status: DC

## 2018-05-15 MED ORDER — HYDROXYZINE HCL 25 MG PO TABS: 25.0000 mg | ORAL_TABLET | Freq: Every day | ORAL | 1 refills | Status: DC

## 2018-05-15 MED ORDER — VERAPAMIL HCL ER 300 MG PO CP24: ORAL_CAPSULE | ORAL | 1 refills | Status: DC

## 2018-05-15 NOTE — Progress Notes (Signed)
Subjective:  Patient ID: Melanie Todd, female    DOB: 14-Mar-1941  Age: 77 y.o. MRN: [id number]  CC: Medical Management of Chronic Issues   HPI Kaysee Hergert presents for continued neck pain.  It is now radiating to the left shoulder she feels like there is something crawling under her skin over the superior trapezius border and anteriorly.  She is concerned it might be a vein.  However it does follow a dermatomal distribution.  She continues to use the tramadol but had to take 2 yesterday because of headache from sinuses.  Patient in for follow-up of GERD. Currently asymptomatic taking  PPI daily. There is no chest pain or heartburn. No hematemesis and no melena. No dysphagia or choking. Onset is remote. Progression is stable. Complicating factors, none.   Symptoms include congestion, facial pain, nasal congestion, post nasal drip and left frontal sinus pressure. There is no fever, chills, or sweats. Onset of symptoms was 5  days ago, gradually worsening since that time.    Depression screen [facility] 2/9 05/15/2018 01/25/2018 09/25/2017  Decreased Interest 0 0 0  Down, Depressed, Hopeless 0 0 0  PHQ - 2 Score 0 0 0    History Corinthia has a past medical history of GERD (gastroesophageal reflux disease) and Hypertension.   She has a past surgical history that includes Spine surgery; Hemorroidectomy; and Cholecystectomy.   Her family history includes Cancer in her brother and father; Diabetes in her mother; Heart disease in her mother; Osteoporosis in her maternal grandmother and mother.She reports that she quit smoking about 15 years ago. She has never used smokeless tobacco. She reports that she does not drink alcohol or use drugs.    ROS Review of Systems  Constitutional: Positive for appetite change (thiinks it is less due to celebrex). Negative for activity change, chills and fever.  HENT: Positive for postnasal drip, rhinorrhea and sinus pressure. Negative for congestion, ear discharge, ear  pain, hearing loss, nosebleeds, sneezing and trouble swallowing.   Eyes: Negative for visual disturbance.  Respiratory: Negative for chest tightness and shortness of breath.   Cardiovascular: Negative for chest pain and palpitations.  Gastrointestinal: Negative for abdominal pain, constipation, diarrhea, nausea and vomiting.  Genitourinary: Negative for difficulty urinating.  Musculoskeletal: Negative for arthralgias and myalgias.  Skin: Negative for rash.  Neurological: Negative for headaches.  Psychiatric/Behavioral: Negative for sleep disturbance.    Objective:  BP 137/78   Pulse 74   Temp 97.6 F (36.4 C) (Oral)   Ht '5\' 4"'$  (1.626 m)   Wt 156 lb 6 oz (70.9 kg)   BMI 26.84 kg/m   BP Readings from Last 3 Encounters:  05/15/18 137/78  01/25/18 130/71  12/25/17 129/76    Wt Readings from Last 3 Encounters:  05/15/18 156 lb 6 oz (70.9 kg)  01/25/18 159 lb 2 oz (72.2 kg)  12/25/17 160 lb (72.6 kg)     Physical Exam  Constitutional: She is oriented to person, place, and time. She appears well-developed and well-nourished. No distress.  HENT:  Head: Normocephalic and atraumatic.  Right Ear: Tympanic membrane and external ear normal. No decreased hearing is noted.  Left Ear: Tympanic membrane and external ear normal. No decreased hearing is noted.  Nose: Mucosal edema present. Right sinus exhibits no frontal sinus tenderness. Left sinus exhibits no frontal sinus tenderness.  Mouth/Throat: Oropharynx is clear and moist. No oropharyngeal exudate or posterior oropharyngeal erythema.  Eyes: Pupils are equal, round, and reactive to light. Conjunctivae and EOM are  normal.  Neck: Normal range of motion. Neck supple. No Brudzinski's sign noted. No thyromegaly present.  Cardiovascular: Normal rate, regular rhythm and normal heart sounds.  No murmur heard. Pulmonary/Chest: Effort normal and breath sounds normal. No respiratory distress. She has no wheezes. She has no rales.    Abdominal: Soft. There is no tenderness.  Lymphadenopathy:       Head (right side): No preauricular adenopathy present.       Head (left side): No preauricular adenopathy present.    She has no cervical adenopathy.       Right cervical: No superficial cervical adenopathy present.      Left cervical: No superficial cervical adenopathy present.  Neurological: She is alert and oriented to person, place, and time. She has normal reflexes.  Skin: Skin is warm and dry.  Psychiatric: She has a normal mood and affect. Her behavior is normal. Judgment and thought content normal.      Assessment & Plan:   Peggi was seen today for medical management of chronic issues.  Diagnoses and all orders for this visit:  Cervical disc herniation  Essential hypertension -     CMP14+EGFR  Acute frontal sinusitis, recurrence not specified  Pain management contract agreement -     ToxASSURE Select 13 (MW), Urine  Other orders -     predniSONE (DELTASONE) 10 MG tablet; Take 5 PO x 3 days, then 4 PO x 3 days, then 3 PO x 3 days, then 2 PO x 3 days then 1 PO x 3 days. -     amoxicillin-clavulanate (AUGMENTIN) 875-125 MG tablet; Take 1 tablet by mouth 2 (two) times daily. -     celecoxib (CELEBREX) 200 MG capsule; Take 1 capsule (200 mg total) by mouth 2 (two) times daily. -     conjugated estrogens (PREMARIN) vaginal cream; Use 1 gram vaginally 3 times weekly -     hydrOXYzine (ATARAX/VISTARIL) 25 MG tablet; Take 1-2 tablets (25-50 mg total) by mouth at bedtime. -     Verapamil HCl CR 300 MG CP24; TAKE 1 CAPSULE BY MOUTH EVERY DAY FOR BLOOD PRESSURE AS DIRECTED ORALLY 90 DAYS -     traMADol (ULTRAM) 50 MG tablet; Take 1 tablet (50 mg total) by mouth daily.       I am having [name] start on predniSONE and amoxicillin-clavulanate. I am also having her maintain her Multiple Vitamins-Minerals (ONE DAILY MULTIVITAMIN WOMEN PO), pantoprazole, celecoxib, conjugated estrogens, hydrOXYzine, Verapamil  HCl CR, and traMADol.  Allergies as of 05/15/2018      Reactions   Codeine    Sulfa Antibiotics       Medication List        Accurate as of 05/15/18  9:48 AM. Always use your most recent med list.          amoxicillin-clavulanate 875-125 MG tablet Commonly known as:  AUGMENTIN Take 1 tablet by mouth 2 (two) times daily.   celecoxib 200 MG capsule Commonly known as:  CELEBREX Take 1 capsule (200 mg total) by mouth 2 (two) times daily.   conjugated estrogens vaginal cream Commonly known as:  PREMARIN Use 1 gram vaginally 3 times weekly   hydrOXYzine 25 MG tablet Commonly known as:  ATARAX/VISTARIL Take 1-2 tablets (25-50 mg total) by mouth at bedtime.   ONE DAILY MULTIVITAMIN WOMEN PO Take 1 capsule by mouth daily.   pantoprazole 40 MG tablet Commonly known as:  PROTONIX Take 1 tablet (40 mg total) by mouth daily.  predniSONE 10 MG tablet Commonly known as:  DELTASONE Take 5 PO x 3 days, then 4 PO x 3 days, then 3 PO x 3 days, then 2 PO x 3 days then 1 PO x 3 days.   traMADol 50 MG tablet Commonly known as:  ULTRAM Take 1 tablet (50 mg total) by mouth daily.   Verapamil HCl CR 300 MG Cp24 TAKE 1 CAPSULE BY MOUTH EVERY DAY FOR BLOOD PRESSURE AS DIRECTED ORALLY 90 DAYS        Follow-up: Return in about 6 months (around 11/13/2018), or if symptoms worsen or fail to improve.  Claretta Fraise, M.D.

## 2018-05-15 NOTE — Patient Instructions (Signed)
DASH Eating Plan DASH stands for "Dietary Approaches to Stop Hypertension." The DASH eating plan is a healthy eating plan that has been shown to reduce high blood pressure (hypertension). It may also reduce your risk for type 2 diabetes, heart disease, and stroke. The DASH eating plan may also help with weight loss. What are tips for following this plan? General guidelines  Avoid eating more than 2,300 mg (milligrams) of salt (sodium) a day. If you have hypertension, you may need to reduce your sodium intake to 1,500 mg a day.  Limit alcohol intake to no more than 1 drink a day for nonpregnant women and 2 drinks a day for men. One drink equals 12 oz of beer, 5 oz of wine, or 1 oz of hard liquor.  Work with your health care provider to maintain a healthy body weight or to lose weight. Ask what an ideal weight is for you.  Get at least 30 minutes of exercise that causes your heart to beat faster (aerobic exercise) most days of the week. Activities may include walking, swimming, or biking.  Work with your health care provider or diet and nutrition specialist (dietitian) to adjust your eating plan to your individual calorie needs. Reading food labels  Check food labels for the amount of sodium per serving. Choose foods with less than 5 percent of the Daily Value of sodium. Generally, foods with less than 300 mg of sodium per serving fit into this eating plan.  To find whole grains, look for the word "whole" as the first word in the ingredient list. Shopping  Buy products labeled as "low-sodium" or "no salt added."  Buy fresh foods. Avoid canned foods and premade or frozen meals. Cooking  Avoid adding salt when cooking. Use salt-free seasonings or herbs instead of table salt or sea salt. Check with your health care provider or pharmacist before using salt substitutes.  Do not fry foods. Cook foods using healthy methods such as baking, boiling, grilling, and broiling instead.  Cook with  heart-healthy oils, such as olive, canola, soybean, or sunflower oil. Meal planning   Eat a balanced diet that includes: ? 5 or more servings of fruits and vegetables each day. At each meal, try to fill half of your plate with fruits and vegetables. ? Up to 6-8 servings of whole grains each day. ? Less than 6 oz of lean meat, poultry, or fish each day. A 3-oz serving of meat is about the same size as a deck of cards. One egg equals 1 oz. ? 2 servings of low-fat dairy each day. ? A serving of nuts, seeds, or beans 5 times each week. ? Heart-healthy fats. Healthy fats called Omega-3 fatty acids are found in foods such as flaxseeds and coldwater fish, like sardines, salmon, and mackerel.  Limit how much you eat of the following: ? Canned or prepackaged foods. ? Food that is high in trans fat, such as fried foods. ? Food that is high in saturated fat, such as fatty meat. ? Sweets, desserts, sugary drinks, and other foods with added sugar. ? Full-fat dairy products.  Do not salt foods before eating.  Try to eat at least 2 vegetarian meals each week.  Eat more home-cooked food and less restaurant, buffet, and fast food.  When eating at a restaurant, ask that your food be prepared with less salt or no salt, if possible. What foods are recommended? The items listed may not be a complete list. Talk with your dietitian about what   dietary choices are best for you. Grains Whole-grain or whole-wheat bread. Whole-grain or whole-wheat pasta. Brown rice. Oatmeal. Quinoa. Bulgur. Whole-grain and low-sodium cereals. Pita bread. Low-fat, low-sodium crackers. Whole-wheat flour tortillas. Vegetables Fresh or frozen vegetables (raw, steamed, roasted, or grilled). Low-sodium or reduced-sodium tomato and vegetable juice. Low-sodium or reduced-sodium tomato sauce and tomato paste. Low-sodium or reduced-sodium canned vegetables. Fruits All fresh, dried, or frozen fruit. Canned fruit in natural juice (without  added sugar). Meat and other protein foods Skinless chicken or turkey. Ground chicken or turkey. Pork with fat trimmed off. Fish and seafood. Egg whites. Dried beans, peas, or lentils. Unsalted nuts, nut butters, and seeds. Unsalted canned beans. Lean cuts of beef with fat trimmed off. Low-sodium, lean deli meat. Dairy Low-fat (1%) or fat-free (skim) milk. Fat-free, low-fat, or reduced-fat cheeses. Nonfat, low-sodium ricotta or cottage cheese. Low-fat or nonfat yogurt. Low-fat, low-sodium cheese. Fats and oils Soft margarine without trans fats. Vegetable oil. Low-fat, reduced-fat, or light mayonnaise and salad dressings (reduced-sodium). Canola, safflower, olive, soybean, and sunflower oils. Avocado. Seasoning and other foods Herbs. Spices. Seasoning mixes without salt. Unsalted popcorn and pretzels. Fat-free sweets. What foods are not recommended? The items listed may not be a complete list. Talk with your dietitian about what dietary choices are best for you. Grains Baked goods made with fat, such as croissants, muffins, or some breads. Dry pasta or rice meal packs. Vegetables Creamed or fried vegetables. Vegetables in a cheese sauce. Regular canned vegetables (not low-sodium or reduced-sodium). Regular canned tomato sauce and paste (not low-sodium or reduced-sodium). Regular tomato and vegetable juice (not low-sodium or reduced-sodium). Pickles. Olives. Fruits Canned fruit in a light or heavy syrup. Fried fruit. Fruit in cream or butter sauce. Meat and other protein foods Fatty cuts of meat. Ribs. Fried meat. Bacon. Sausage. Bologna and other processed lunch meats. Salami. Fatback. Hotdogs. Bratwurst. Salted nuts and seeds. Canned beans with added salt. Canned or smoked fish. Whole eggs or egg yolks. Chicken or turkey with skin. Dairy Whole or 2% milk, cream, and half-and-half. Whole or full-fat cream cheese. Whole-fat or sweetened yogurt. Full-fat cheese. Nondairy creamers. Whipped toppings.  Processed cheese and cheese spreads. Fats and oils Butter. Stick margarine. Lard. Shortening. Ghee. Bacon fat. Tropical oils, such as coconut, palm kernel, or palm oil. Seasoning and other foods Salted popcorn and pretzels. Onion salt, garlic salt, seasoned salt, table salt, and sea salt. Worcestershire sauce. Tartar sauce. Barbecue sauce. Teriyaki sauce. Soy sauce, including reduced-sodium. Steak sauce. Canned and packaged gravies. Fish sauce. Oyster sauce. Cocktail sauce. Horseradish that you find on the shelf. Ketchup. Mustard. Meat flavorings and tenderizers. Bouillon cubes. Hot sauce and Tabasco sauce. Premade or packaged marinades. Premade or packaged taco seasonings. Relishes. Regular salad dressings. Where to find more information:  National Heart, Lung, and Blood Institute: www.nhlbi.nih.gov  American Heart Association: www.heart.org Summary  The DASH eating plan is a healthy eating plan that has been shown to reduce high blood pressure (hypertension). It may also reduce your risk for type 2 diabetes, heart disease, and stroke.  With the DASH eating plan, you should limit salt (sodium) intake to 2,300 mg a day. If you have hypertension, you may need to reduce your sodium intake to 1,500 mg a day.  When on the DASH eating plan, aim to eat more fresh fruits and vegetables, whole grains, lean proteins, low-fat dairy, and heart-healthy fats.  Work with your health care provider or diet and nutrition specialist (dietitian) to adjust your eating plan to your individual   calorie needs. This information is not intended to replace advice given to you by your health care provider. Make sure you discuss any questions you have with your health care provider. Document Released: 07/27/2011 Document Revised: 07/31/2016 Document Reviewed: 07/31/2016 Elsevier Interactive Patient Education  2018 Elsevier Inc.  

## 2018-05-16 ENCOUNTER — Telehealth: Payer: Self-pay | Admitting: Family Medicine

## 2018-05-16 MED ORDER — DOXYCYCLINE HYCLATE 100 MG PO TABS: 100.0000 mg | ORAL_TABLET | Freq: Two times a day (BID) | ORAL | 0 refills | Status: DC

## 2018-05-16 NOTE — Telephone Encounter (Signed)
Sent in doxycycline, stop the augmentin. If symptoms return or worsen needs to be seen.

## 2018-05-16 NOTE — Telephone Encounter (Signed)
Can we change her to something milder

## 2018-05-16 NOTE — Telephone Encounter (Signed)
Pt aware.

## 2018-05-18 LAB — TOXASSURE SELECT 13 (MW), URINE

## 2018-05-20 ENCOUNTER — Ambulatory Visit: Payer: Medicare Other | Admitting: Family Medicine

## 2018-06-21 DIAGNOSIS — Z23 Encounter for immunization: Secondary | ICD-10-CM | POA: Diagnosis not present

## 2018-07-29 ENCOUNTER — Ambulatory Visit: Payer: Medicare Other | Admitting: Family Medicine

## 2018-08-16 ENCOUNTER — Ambulatory Visit: Payer: Medicare Other | Admitting: Family Medicine

## 2018-08-16 ENCOUNTER — Encounter: Payer: Self-pay | Admitting: Family Medicine

## 2018-08-16 ENCOUNTER — Ambulatory Visit (INDEPENDENT_AMBULATORY_CARE_PROVIDER_SITE_OTHER): Payer: Medicare Other | Admitting: Family Medicine

## 2018-08-16 VITALS — BP 158/81 | HR 68 | Temp 97.5°F | Ht 64.0 in | Wt 156.2 lb

## 2018-08-16 DIAGNOSIS — I10 Essential (primary) hypertension: Secondary | ICD-10-CM | POA: Diagnosis not present

## 2018-08-16 DIAGNOSIS — K146 Glossodynia: Secondary | ICD-10-CM | POA: Diagnosis not present

## 2018-08-16 DIAGNOSIS — G959 Disease of spinal cord, unspecified: Secondary | ICD-10-CM | POA: Diagnosis not present

## 2018-08-16 MED ORDER — TRAMADOL HCL 50 MG PO TABS: 50.0000 mg | ORAL_TABLET | Freq: Every day | ORAL | 5 refills | Status: AC

## 2018-08-16 MED ORDER — PANTOPRAZOLE SODIUM 40 MG PO TBEC: 40.0000 mg | DELAYED_RELEASE_TABLET | Freq: Every day | ORAL | 1 refills | Status: DC

## 2018-08-16 MED ORDER — CLOTRIMAZOLE 10 MG MT TROC: OROMUCOSAL | 2 refills | Status: AC

## 2018-08-16 MED ORDER — TRAMADOL HCL 50 MG PO TABS: 50.0000 mg | ORAL_TABLET | Freq: Every day | ORAL | 2 refills | Status: DC

## 2018-08-16 MED ORDER — CELECOXIB 200 MG PO CAPS: 200.0000 mg | ORAL_CAPSULE | Freq: Two times a day (BID) | ORAL | 1 refills | Status: DC

## 2018-08-16 MED ORDER — VERAPAMIL HCL ER 300 MG PO CP24: ORAL_CAPSULE | ORAL | 1 refills | Status: DC

## 2018-08-16 MED ORDER — HYDROXYZINE HCL 25 MG PO TABS: 25.0000 mg | ORAL_TABLET | Freq: Every day | ORAL | 1 refills | Status: DC

## 2018-08-16 NOTE — Progress Notes (Signed)
Subjective:  Patient ID: Melanie Todd, female    DOB: [birth year]  Age: 77 y.o. MRN: [id number]  CC: pain management   HPI Melanie Todd presents for Recheck of her chronic neck pain.  She usually takes a tramadol around lunchtime.  A couple times a month she may need an extra 1 in the evening.  She states that when her pain gets up to about 7/10 she will go ahead and take a tramadol.  Within an hour it drops to about 4/10.  This is tolerable for her.  Additionally she notes that she has had some burning sensation in her mouth intermittently for the last 3 months.  She had taken some doxycycline for sinus infection at that time.  She wonders if this is thrush.   Follow-up of hypertension. Patient has no history of headache chest pain or shortness of breath or recent cough. Patient also denies symptoms of TIA such as numbness weakness lateralizing. Patient checks  blood pressure at home and has not had any elevated readings recently. Patient denies side effects from his medication. States taking it regularly.   Depression screen [facility] 2/9 05/15/2018 01/25/2018 09/25/2017  Decreased Interest 0 0 0  Down, Depressed, Hopeless 0 0 0  PHQ - 2 Score 0 0 0    History Melanie Todd has a past medical history of GERD (gastroesophageal reflux disease) and Hypertension.   She has a past surgical history that includes Spine surgery; Hemorroidectomy; and Cholecystectomy.   Her family history includes Cancer in her brother and father; Diabetes in her mother; Heart disease in her mother; Osteoporosis in her maternal grandmother and mother.She reports that she quit smoking about 15 years ago. She has never used smokeless tobacco. She reports that she does not drink alcohol or use drugs.    ROS Review of Systems  Constitutional: Negative.   HENT: Negative for congestion.   Eyes: Negative for visual disturbance.  Respiratory: Negative for shortness of breath.   Cardiovascular: Negative for chest pain.    Gastrointestinal: Negative for abdominal pain, constipation, diarrhea, nausea and vomiting.  Genitourinary: Negative for difficulty urinating.  Musculoskeletal: Negative for arthralgias and myalgias.  Neurological: Negative for headaches.  Psychiatric/Behavioral: Negative for sleep disturbance.    Objective:  BP (!) 158/81   Pulse 68   Temp (!) 97.5 F (36.4 C) (Oral)   Ht '5\' 4"'$  (1.626 m)   Wt 156 lb 3.2 oz (70.9 kg)   BMI 26.81 kg/m   BP Readings from Last 3 Encounters:  08/16/18 (!) 158/81  05/15/18 137/78  01/25/18 130/71    Wt Readings from Last 3 Encounters:  08/16/18 156 lb 3.2 oz (70.9 kg)  05/15/18 156 lb 6 oz (70.9 kg)  01/25/18 159 lb 2 oz (72.2 kg)     Physical Exam Constitutional:      General: She is not in acute distress.    Appearance: She is well-developed.  HENT:     Head: Normocephalic and atraumatic.     Right Ear: Tympanic membrane normal.     Left Ear: Tympanic membrane normal.     Mouth/Throat:     Pharynx: Posterior oropharyngeal erythema (Tongue is cherry red, no plaques) present.  Eyes:     Conjunctiva/sclera: Conjunctivae normal.     Pupils: Pupils are equal, round, and reactive to light.  Neck:     Musculoskeletal: Normal range of motion and neck supple.     Thyroid: No thyromegaly.  Cardiovascular:     Rate and Rhythm: Normal  rate and regular rhythm.     Heart sounds: Normal heart sounds. No murmur.  Pulmonary:     Effort: Pulmonary effort is normal. No respiratory distress.     Breath sounds: Normal breath sounds. No wheezing or rales.  Abdominal:     General: Bowel sounds are normal. There is no distension.     Palpations: Abdomen is soft.     Tenderness: There is no abdominal tenderness.  Musculoskeletal: Normal range of motion.  Lymphadenopathy:     Cervical: No cervical adenopathy.  Skin:    General: Skin is warm and dry.  Neurological:     Mental Status: She is alert and oriented to person, place, and time.   Psychiatric:        Behavior: Behavior normal.        Thought Content: Thought content normal.        Judgment: Judgment normal.       Assessment & Plan:   Berta was seen today for pain management.  Diagnoses and all orders for this visit:  Glossodynia -     CMP14+EGFR -     CBC with Differential/Platelet -     Vitamin B12 -     Folate -     clotrimazole (MYCELEX) 10 MG troche; Allow one to dissolve in the mouth 5 times daily For yeast  Cervical myelopathy (HCC) -     celecoxib (CELEBREX) 200 MG capsule; Take 1 capsule (200 mg total) by mouth 2 (two) times daily. -     traMADol (ULTRAM) 50 MG tablet; Take 1 tablet (50 mg total) by mouth daily.  Essential hypertension -     Verapamil HCl CR 300 MG CP24; TAKE 1 CAPSULE BY MOUTH EVERY DAY FOR BLOOD PRESSURE AS DIRECTED ORALLY 90 DAYS  Other orders -     hydrOXYzine (ATARAX/VISTARIL) 25 MG tablet; Take 1-2 tablets (25-50 mg total) by mouth at bedtime. -     pantoprazole (PROTONIX) 40 MG tablet; Take 1 tablet (40 mg total) by mouth daily. -     Discontinue: traMADol (ULTRAM) 50 MG tablet; Take 1 tablet (50 mg total) by mouth daily.       I have discontinued Melanie Todd's predniSONE. I am also having her start on clotrimazole. Additionally, I am having her maintain her Multiple Vitamins-Minerals (ONE DAILY MULTIVITAMIN WOMEN PO), conjugated estrogens, doxycycline, celecoxib, hydrOXYzine, pantoprazole, Verapamil HCl CR, and traMADol.  Allergies as of 08/16/2018      Reactions   Codeine    Sulfa Antibiotics       Medication List       Accurate as of August 16, 2018  2:46 PM. Always use your most recent med list.        celecoxib 200 MG capsule Commonly known as:  CELEBREX Take 1 capsule (200 mg total) by mouth 2 (two) times daily.   clotrimazole 10 MG troche Commonly known as:  MYCELEX Allow one to dissolve in the mouth 5 times daily For yeast   conjugated estrogens vaginal cream Commonly known as:   PREMARIN Use 1 gram vaginally 3 times weekly   doxycycline 100 MG tablet Commonly known as:  VIBRA-TABS Take 1 tablet (100 mg total) by mouth 2 (two) times daily.   hydrOXYzine 25 MG tablet Commonly known as:  ATARAX/VISTARIL Take 1-2 tablets (25-50 mg total) by mouth at bedtime.   ONE DAILY MULTIVITAMIN WOMEN PO Take 1 capsule by mouth daily.   pantoprazole 40 MG tablet Commonly known as:  PROTONIX  Take 1 tablet (40 mg total) by mouth daily.   traMADol 50 MG tablet Commonly known as:  ULTRAM Take 1 tablet (50 mg total) by mouth daily.   Verapamil HCl CR 300 MG Cp24 TAKE 1 CAPSULE BY MOUTH EVERY DAY FOR BLOOD PRESSURE AS DIRECTED ORALLY 90 DAYS        Follow-up: Return in about 6 months (around 02/15/2019), or if symptoms worsen or fail to improve.  Claretta Fraise, M.D.

## 2018-08-17 LAB — CBC WITH DIFFERENTIAL/PLATELET
Basophils Absolute: 0 10*3/uL (ref 0.0–0.2)
Basos: 1 %
EOS (ABSOLUTE): 0.1 10*3/uL (ref 0.0–0.4)
Eos: 3 %
Hematocrit: 45.8 % (ref 34.0–46.6)
Hemoglobin: 14.8 g/dL (ref 11.1–15.9)
Immature Grans (Abs): 0 10*3/uL (ref 0.0–0.1)
Immature Granulocytes: 0 %
LYMPHS: 30 %
Lymphocytes Absolute: 1.7 10*3/uL (ref 0.7–3.1)
MCH: 27.8 pg (ref 26.6–33.0)
MCHC: 32.3 g/dL (ref 31.5–35.7)
MCV: 86 fL (ref 79–97)
MONOCYTES: 7 %
Monocytes Absolute: 0.4 10*3/uL (ref 0.1–0.9)
Neutrophils Absolute: 3.4 10*3/uL (ref 1.4–7.0)
Neutrophils: 59 %
Platelets: 241 10*3/uL (ref 150–450)
RBC: 5.33 x10E6/uL — ABNORMAL HIGH (ref 3.77–5.28)
RDW: 13.1 % (ref 12.3–15.4)
WBC: 5.6 10*3/uL (ref 3.4–10.8)

## 2018-08-17 LAB — CMP14+EGFR
ALT: 33 IU/L — AB (ref 0–32)
AST: 30 IU/L (ref 0–40)
Albumin/Globulin Ratio: 1.6 (ref 1.2–2.2)
Albumin: 4.2 g/dL (ref 3.5–4.8)
Alkaline Phosphatase: 63 IU/L (ref 39–117)
BUN/Creatinine Ratio: 21 (ref 12–28)
BUN: 16 mg/dL (ref 8–27)
Bilirubin Total: 0.4 mg/dL (ref 0.0–1.2)
CO2: 24 mmol/L (ref 20–29)
Calcium: 9.9 mg/dL (ref 8.7–10.3)
Chloride: 103 mmol/L (ref 96–106)
Creatinine, Ser: 0.77 mg/dL (ref 0.57–1.00)
GFR calc Af Amer: 86 mL/min/{1.73_m2} (ref >59)
GFR calc non Af Amer: 74 mL/min/{1.73_m2} (ref >59)
Globulin, Total: 2.7 g/dL (ref 1.5–4.5)
Glucose: 100 mg/dL — ABNORMAL HIGH (ref 65–99)
Potassium: 4.1 mmol/L (ref 3.5–5.2)
Sodium: 143 mmol/L (ref 134–144)
Total Protein: 6.9 g/dL (ref 6.0–8.5)

## 2018-08-17 LAB — FOLATE: Folate: >20 ng/mL (ref >3.0)

## 2018-08-17 LAB — VITAMIN B12: VITAMIN B 12: 542 pg/mL (ref 232–1245)

## 2018-08-19 NOTE — Progress Notes (Signed)
Hello Roselynne,  Your lab result is normal.Some minor variations that are not significant are commonly marked abnormal, but do not represent any medical problem for you.  Best regards, Claretta Fraise, M.D.

## 2018-09-04 DIAGNOSIS — R319 Hematuria, unspecified: Secondary | ICD-10-CM | POA: Diagnosis not present

## 2018-09-04 DIAGNOSIS — N390 Urinary tract infection, site not specified: Secondary | ICD-10-CM | POA: Diagnosis not present

## 2018-09-04 DIAGNOSIS — R309 Painful micturition, unspecified: Secondary | ICD-10-CM | POA: Diagnosis not present

## 2018-09-17 ENCOUNTER — Encounter: Payer: Self-pay | Admitting: Family Medicine

## 2018-09-17 ENCOUNTER — Ambulatory Visit (INDEPENDENT_AMBULATORY_CARE_PROVIDER_SITE_OTHER): Payer: Medicare Other | Admitting: Family Medicine

## 2018-09-17 VITALS — BP 132/78 | HR 70 | Temp 97.8°F | Ht 64.0 in | Wt 158.1 lb

## 2018-09-17 DIAGNOSIS — B372 Candidiasis of skin and nail: Secondary | ICD-10-CM | POA: Diagnosis not present

## 2018-09-17 DIAGNOSIS — R300 Dysuria: Secondary | ICD-10-CM

## 2018-09-17 LAB — URINALYSIS
Bilirubin, UA: NEGATIVE
Glucose, UA: NEGATIVE
Ketones, UA: NEGATIVE
Leukocytes, UA: NEGATIVE
Nitrite, UA: NEGATIVE
PROTEIN UA: NEGATIVE
RBC, UA: NEGATIVE
Specific Gravity, UA: 1.01 (ref 1.005–1.030)
Urobilinogen, Ur: 0.2 mg/dL (ref 0.2–1.0)
pH, UA: 6 (ref 5.0–7.5)

## 2018-09-17 MED ORDER — FLUCONAZOLE 200 MG PO TABS: 200.0000 mg | ORAL_TABLET | Freq: Every day | ORAL | 0 refills | Status: DC

## 2018-09-17 NOTE — Progress Notes (Signed)
Chief Complaint  Patient presents with  . Dysuria    HPI  Patient presents today for rash at groin for several days. Prurutic. Had to go through multiple treatments in the past and would like to use the fluconazole that worked best. (Has empty prescription bottle. No vaginal DC. Some burning with urination  PMH: Smoking status noted ROS: Per HPI  Objective: BP 132/78   Pulse 70   Temp 97.8 F (36.6 C) (Oral)   Ht 5\' 4"  (1.626 m)   Wt 158 lb 2 oz (71.7 kg)   BMI 27.14 kg/m  Gen: NAD, alert, cooperative with exam HEENT: NCAT, EOMI, PERRL Skin: erythema at groin. Neuro: Alert and oriented, No gross deficits  Assessment and plan:  1. Dysuria   2. Yeast dermatitis     Meds ordered this encounter  Medications  . fluconazole (DIFLUCAN) 200 MG tablet    Sig: Take 1 tablet (200 mg total) by mouth daily.    Dispense:  30 tablet    Refill:  0    Orders Placed This Encounter  Procedures  . Urine Culture  . Urinalysis    Follow up as needed.  Claretta Fraise, MD

## 2018-09-18 LAB — URINE CULTURE

## 2018-09-24 ENCOUNTER — Encounter: Payer: Self-pay | Admitting: Family Medicine

## 2018-10-27 DIAGNOSIS — J0100 Acute maxillary sinusitis, unspecified: Secondary | ICD-10-CM | POA: Diagnosis not present

## 2018-10-27 DIAGNOSIS — M5431 Sciatica, right side: Secondary | ICD-10-CM | POA: Diagnosis not present

## 2018-11-06 DIAGNOSIS — H40013 Open angle with borderline findings, low risk, bilateral: Secondary | ICD-10-CM | POA: Diagnosis not present

## 2018-11-06 DIAGNOSIS — H2513 Age-related nuclear cataract, bilateral: Secondary | ICD-10-CM | POA: Diagnosis not present

## 2018-12-13 ENCOUNTER — Ambulatory Visit (INDEPENDENT_AMBULATORY_CARE_PROVIDER_SITE_OTHER): Payer: Medicare Other | Admitting: Family Medicine

## 2018-12-13 ENCOUNTER — Other Ambulatory Visit: Payer: Self-pay

## 2018-12-13 DIAGNOSIS — B373 Candidiasis of vulva and vagina: Secondary | ICD-10-CM | POA: Diagnosis not present

## 2018-12-13 DIAGNOSIS — B9689 Other specified bacterial agents as the cause of diseases classified elsewhere: Secondary | ICD-10-CM

## 2018-12-13 DIAGNOSIS — J019 Acute sinusitis, unspecified: Secondary | ICD-10-CM | POA: Diagnosis not present

## 2018-12-13 DIAGNOSIS — B3731 Acute candidiasis of vulva and vagina: Secondary | ICD-10-CM

## 2018-12-13 MED ORDER — FLUCONAZOLE 150 MG PO TABS: 150.0000 mg | ORAL_TABLET | Freq: Once | ORAL | 0 refills | Status: AC

## 2018-12-13 MED ORDER — CEFDINIR 300 MG PO CAPS: 300.0000 mg | ORAL_CAPSULE | Freq: Two times a day (BID) | ORAL | 0 refills | Status: DC

## 2018-12-13 NOTE — Progress Notes (Signed)
Telephone visit  Subjective: CC: ?sinus infection PCP: Claretta Fraise, MD [id number] Melanie Todd is a 78 y.o. female calls for telephone consult today. Patient provides verbal consent for consult held via phone.  Location of patient: home Location of provider: [facility] Others present for call: none  1.  Sinusitis Patient reports a 2-day history of severe sinus pressure with associated headache.  She reports associated left-sided ear pain.  She has yellow/greenish discharge from the nares and subjective fevers at home.  Denies any cough, shortness of breath, wheeze or hemoptysis.  She has been using tramadol which did help somewhat the headache and Flonase nasal spray.  No known sick contacts.  Last antibiotic was in March with azithromycin for similar.   ROS: Per HPI  Allergies  Allergen Reactions  . Codeine   . Sulfa Antibiotics    Past Medical History:  Diagnosis Date  . GERD (gastroesophageal reflux disease)   . Hypertension     Current Outpatient Medications:  .  celecoxib (CELEBREX) 200 MG capsule, Take 1 capsule (200 mg total) by mouth 2 (two) times daily., Disp: 180 capsule, Rfl: 1 .  clotrimazole (MYCELEX) 10 MG troche, Allow one to dissolve in the mouth 5 times daily For yeast, Disp: 35 tablet, Rfl: 2 .  conjugated estrogens (PREMARIN) vaginal cream, Use 1 gram vaginally 3 times weekly, Disp: 42.5 g, Rfl: 3 .  fluconazole (DIFLUCAN) 200 MG tablet, Take 1 tablet (200 mg total) by mouth daily., Disp: 30 tablet, Rfl: 0 .  hydrOXYzine (ATARAX/VISTARIL) 25 MG tablet, Take 1-2 tablets (25-50 mg total) by mouth at bedtime., Disp: 180 tablet, Rfl: 1 .  Multiple Vitamins-Minerals (ONE DAILY MULTIVITAMIN WOMEN PO), Take 1 capsule by mouth daily., Disp: , Rfl:  .  pantoprazole (PROTONIX) 40 MG tablet, Take 1 tablet (40 mg total) by mouth daily., Disp: 90 tablet, Rfl: 1 .  traMADol (ULTRAM) 50 MG tablet, Take 1 tablet (50 mg total) by mouth daily., Disp: 30 tablet, Rfl: 5 .  Verapamil HCl CR  300 MG CP24, TAKE 1 CAPSULE BY MOUTH EVERY DAY FOR BLOOD PRESSURE AS DIRECTED ORALLY 90 DAYS, Disp: 90 capsule, Rfl: 1  Assessment/ Plan: 78 y.o. female   1. Acute bacterial sinusitis We will empirically treat with Omnicef twice daily for 10 days for presumed acute bacterial sinusitis.  I have encouraged her to use Flonase nasal spray and oral antihistamine for ongoing sinus symptoms.  Isolation recommended given that we cannot rule out COVID-19 at this time.  We discussed reasons for reevaluation.  She will follow-up PRN - cefdinir (OMNICEF) 300 MG capsule; Take 1 capsule (300 mg total) by mouth 2 (two) times daily. 1 po BID  Dispense: 20 capsule; Refill: 0  2. Yeast vaginitis Uncertain as to why patient was being prescribed Diflucan 200 mg daily for yeast infection.  I did inform her that typical dose is 150 mg with repeat in 3 days if she has persistent symptoms.  If she has ongoing yeast symptoms would have a low threshold to evaluate for causes of immunocompromise state. - fluconazole (DIFLUCAN) 150 MG tablet; Take 1 tablet (150 mg total) by mouth once for 1 dose. May repeat dose in 3 days if needed for ongoing symptoms.  Dispense: 1 tablet; Refill: 0   Start time: 12:30p End time: 12:37p  Total time spent on patient care (including telephone call/ virtual visit): 15 mins  [employer], [place] [phone]

## 2019-01-15 ENCOUNTER — Telehealth: Payer: Self-pay | Admitting: *Deleted

## 2019-01-15 ENCOUNTER — Other Ambulatory Visit: Payer: Self-pay

## 2019-01-15 NOTE — Telephone Encounter (Signed)
Triage Telephone Call  Incoming call from Melanie Todd who is a 78 year old female primary care patient of Dr Livia Snellen. She is concerned about bright red vaginal bleeding and diffuse abdominal pain across the bottom of her abdomen. States that it feels like period cramps. Reports wearing a pad for a couple of months because of a dark vaginal discharge. Discharge became bright red over the past few days. Bleeding is light and does saturate a pad. There are no clots.   Hx of uterus and cervix removal years ago for non cancerous reasons. No known history of vaginal lesions or masses.   Plan  Appointment scheduled with Monia Pouch, FNP for tomorrow morning.   Patient negatively screened for COVID19 and advised to wear a mask.  Advised that she should expect a pelvic exam and most likely will need additional tests like an ultrasound and bloodwork.  Advised to contact our office at [phone] if any new or worsening symptoms develop prior to appointment. Seek emergency medical attention for heavy bleeding.

## 2019-01-16 ENCOUNTER — Other Ambulatory Visit: Payer: Self-pay

## 2019-01-16 ENCOUNTER — Ambulatory Visit (INDEPENDENT_AMBULATORY_CARE_PROVIDER_SITE_OTHER): Payer: Medicare Other | Admitting: Family Medicine

## 2019-01-16 ENCOUNTER — Encounter: Payer: Self-pay | Admitting: Family Medicine

## 2019-01-16 VITALS — BP 143/80 | HR 75 | Temp 97.4°F | Ht 64.0 in | Wt 155.0 lb

## 2019-01-16 DIAGNOSIS — N950 Postmenopausal bleeding: Secondary | ICD-10-CM

## 2019-01-16 DIAGNOSIS — R300 Dysuria: Secondary | ICD-10-CM

## 2019-01-16 DIAGNOSIS — N3000 Acute cystitis without hematuria: Secondary | ICD-10-CM

## 2019-01-16 LAB — URINALYSIS, COMPLETE
Bilirubin, UA: NEGATIVE
Glucose, UA: NEGATIVE
Ketones, UA: NEGATIVE
Nitrite, UA: NEGATIVE
Protein,UA: NEGATIVE
RBC, UA: NEGATIVE
Specific Gravity, UA: 1.02 (ref 1.005–1.030)
Urobilinogen, Ur: 0.2 mg/dL (ref 0.2–1.0)
pH, UA: 5.5 (ref 5.0–7.5)

## 2019-01-16 LAB — MICROSCOPIC EXAMINATION

## 2019-01-16 MED ORDER — AMOXICILLIN-POT CLAVULANATE 875-125 MG PO TABS: 1.0000 | ORAL_TABLET | Freq: Two times a day (BID) | ORAL | 0 refills | Status: AC

## 2019-01-16 NOTE — Patient Instructions (Signed)

## 2019-01-16 NOTE — Progress Notes (Signed)
Subjective:  Patient ID: Melanie Todd, female    DOB: 1940-11-19, 78 y.o.   MRN: [id number]  Chief Complaint:  Dysuria and Vaginal Bleeding   HPI: Melanie Todd is a 78 y.o. female presenting on 01/16/2019 for Dysuria and Vaginal Bleeding  Pt presents today with lower abdominal pressure and dysuria. Pt states this started about 3 days ago. No fever, chills, confusion, weakness, or weight changes.  Pt states she has also had vaginal bleeding. States this started out about 4 months ago as dark red blood and has progressed to bright red blood. States she has to wear a panty liner due to the bleeding. No heavy bleeding or clots. Had a hysterectomy at age 66 due to DUB. She denies vaginal pain or discharge. Not sexually active.   Dysuria   This is a new problem. The current episode started in the past 7 days. The problem occurs every urination. The problem has been gradually worsening. The quality of the pain is described as burning. The pain is at a severity of 5/10. The pain is moderate. There has been no fever. She is not sexually active. There is no history of pyelonephritis. Associated symptoms include frequency and urgency. Pertinent negatives include no chills, discharge, flank pain, hematuria, hesitancy, nausea, possible pregnancy, sweats or vomiting. She has tried NSAIDs for the symptoms. The treatment provided mild relief.  Vaginal Bleeding  The patient's primary symptoms include vaginal bleeding. The patient's pertinent negatives include no genital itching, genital lesions, genital odor, genital rash, missed menses, pelvic pain or vaginal discharge. This is a recurrent problem. The current episode started more than 1 month ago. The problem occurs intermittently. The problem has been waxing and waning. The patient is experiencing no pain. She is not pregnant. Associated symptoms include abdominal pain, dysuria, frequency and urgency. Pertinent negatives include no anorexia, back pain, chills,  constipation, diarrhea, discolored urine, fever, flank pain, headaches, hematuria, joint pain, joint swelling, nausea, painful intercourse, rash, sore throat or vomiting. The vaginal discharge was dark and bloody. The vaginal bleeding is lighter than menses. She has not been passing clots. She has not been passing tissue. Nothing aggravates the symptoms. She has tried nothing for the symptoms. She is not sexually active. No, her partner does not have an STD. She uses hysterectomy for contraception. She is postmenopausal. Her past medical history is significant for an abdominal surgery and a gynecological surgery.     Relevant past medical, surgical, family, and social history reviewed and updated as indicated.  Allergies and medications reviewed and updated.   Past Medical History:  Diagnosis Date  . GERD (gastroesophageal reflux disease)   . Hypertension     Past Surgical History:  Procedure Laterality Date  . CHOLECYSTECTOMY    . HEMORROIDECTOMY    . SPINE SURGERY      Social History   Socioeconomic History  . Marital status: Married    Spouse name: Not on file  . Number of children: Not on file  . Years of education: Not on file  . Highest education level: Not on file  Occupational History  . Not on file  Social Needs  . Financial resource strain: Not on file  . Food insecurity:    Worry: Not on file    Inability: Not on file  . Transportation needs:    Medical: Not on file    Non-medical: Not on file  Tobacco Use  . Smoking status: Former Smoker    Last attempt to  quit: 03/04/2003    Years since quitting: 15.8  . Smokeless tobacco: Never Used  Substance and Sexual Activity  . Alcohol use: No  . Drug use: No  . Sexual activity: Not on file  Lifestyle  . Physical activity:    Days per week: Not on file    Minutes per session: Not on file  . Stress: Not on file  Relationships  . Social connections:    Talks on phone: Not on file    Gets together: Not on file     Attends religious service: Not on file    Active member of club or organization: Not on file    Attends meetings of clubs or organizations: Not on file    Relationship status: Not on file  . Intimate partner violence:    Fear of current or ex partner: Not on file    Emotionally abused: Not on file    Physically abused: Not on file    Forced sexual activity: Not on file  Other Topics Concern  . Not on file  Social History Narrative  . Not on file    Outpatient Encounter Medications as of 01/16/2019  Medication Sig  . celecoxib (CELEBREX) 200 MG capsule Take 1 capsule (200 mg total) by mouth 2 (two) times daily.  . clotrimazole (MYCELEX) 10 MG troche Allow one to dissolve in the mouth 5 times daily For yeast  . conjugated estrogens (PREMARIN) vaginal cream Use 1 gram vaginally 3 times weekly  . fluticasone (FLONASE) 50 MCG/ACT nasal spray Place 2 sprays into both nostrils daily.  . hydrOXYzine (ATARAX/VISTARIL) 25 MG tablet Take 1-2 tablets (25-50 mg total) by mouth at bedtime.  . Multiple Vitamins-Minerals (ONE DAILY MULTIVITAMIN WOMEN PO) Take 1 capsule by mouth daily.  . pantoprazole (PROTONIX) 40 MG tablet Take 1 tablet (40 mg total) by mouth daily.  . traMADol (ULTRAM) 50 MG tablet Take 1 tablet (50 mg total) by mouth daily.  . Verapamil HCl CR 300 MG CP24 TAKE 1 CAPSULE BY MOUTH EVERY DAY FOR BLOOD PRESSURE AS DIRECTED ORALLY 90 DAYS  . amoxicillin-clavulanate (AUGMENTIN) 875-125 MG tablet Take 1 tablet by mouth 2 (two) times daily for 5 days.  . [DISCONTINUED] cefdinir (OMNICEF) 300 MG capsule Take 1 capsule (300 mg total) by mouth 2 (two) times daily. 1 po BID   No facility-administered encounter medications on file as of 01/16/2019.     Allergies  Allergen Reactions  . Codeine   . Sulfa Antibiotics     Review of Systems  Constitutional: Negative for activity change, appetite change, chills, diaphoresis, fatigue, fever and unexpected weight change.  HENT: Negative for  sore throat.   Respiratory: Negative for cough, chest tightness and shortness of breath.   Gastrointestinal: Positive for abdominal pain. Negative for abdominal distention, anal bleeding, anorexia, blood in stool, constipation, diarrhea, nausea, rectal pain and vomiting.  Genitourinary: Positive for dysuria, frequency, urgency and vaginal bleeding. Negative for decreased urine volume, difficulty urinating, dyspareunia, enuresis, flank pain, genital sores, hematuria, hesitancy, menstrual problem, missed menses, pelvic pain, vaginal discharge and vaginal pain.  Musculoskeletal: Negative for arthralgias, back pain, joint pain, joint swelling and myalgias.  Skin: Negative for color change and rash.  Neurological: Negative for dizziness, weakness, light-headedness and headaches.  Psychiatric/Behavioral: Negative for confusion.  All other systems reviewed and are negative.       Objective:  BP (!) 143/80   Pulse 75   Temp (!) 97.4 F (36.3 C) (Oral)   Ht 5'  4" (1.626 m)   Wt 155 lb (70.3 kg)   BMI 26.61 kg/m    Wt Readings from Last 3 Encounters:  01/16/19 155 lb (70.3 kg)  09/17/18 158 lb 2 oz (71.7 kg)  08/16/18 156 lb 3.2 oz (70.9 kg)    Physical Exam Vitals signs and nursing note reviewed.  Constitutional:      General: She is not in acute distress.    Appearance: Normal appearance. She is well-developed and well-groomed. She is not ill-appearing or toxic-appearing.  HENT:     Head: Normocephalic and atraumatic.     Nose: Nose normal.     Mouth/Throat:     Mouth: Mucous membranes are moist.     Pharynx: Oropharynx is clear.  Eyes:     Conjunctiva/sclera: Conjunctivae normal.     Pupils: Pupils are equal, round, and reactive to light.  Cardiovascular:     Rate and Rhythm: Normal rate and regular rhythm.     Heart sounds: Normal heart sounds. No murmur. No friction rub. No gallop.   Pulmonary:     Effort: Pulmonary effort is normal. No respiratory distress.     Breath  sounds: Normal breath sounds.  Abdominal:     General: Bowel sounds are normal.     Palpations: Abdomen is soft.     Tenderness: There is no abdominal tenderness. There is no right CVA tenderness or left CVA tenderness.  Genitourinary:    Comments: Declined exam Skin:    General: Skin is warm and dry.     Capillary Refill: Capillary refill takes less than 2 seconds.     Coloration: Skin is not jaundiced or pale.  Neurological:     General: No focal deficit present.     Mental Status: She is alert and oriented to person, place, and time.  Psychiatric:        Mood and Affect: Mood normal.        Behavior: Behavior normal. Behavior is cooperative.        Thought Content: Thought content normal.        Judgment: Judgment normal.     Results for orders placed or performed in visit on 09/17/18  Urine Culture  Result Value Ref Range   Urine Culture, Routine Final report    Organism ID, Bacteria Comment   Urinalysis  Result Value Ref Range   Specific Gravity, UA 1.010 1.005 - 1.030   pH, UA 6.0 5.0 - 7.5   Color, UA Yellow Yellow   Appearance Ur Clear Clear   Leukocytes, UA Negative Negative   Protein, UA Negative Negative/Trace   Glucose, UA Negative Negative   Ketones, UA Negative Negative   RBC, UA Negative Negative   Bilirubin, UA Negative Negative   Urobilinogen, Ur 0.2 0.2 - 1.0 mg/dL   Nitrite, UA Negative Negative       Pertinent labs & imaging results that were available during my care of the patient were reviewed by me and considered in my medical decision making.  Assessment & Plan:  Melanie Todd was seen today for dysuria.  Diagnoses and all orders for this visit:  Acute cystitis without hematuria Urinalysis in office positive for many bacteria, trace leukocytes and negative for nitrites. Culture pending. Due to reported signs and symptoms, will treat with below. Increase water intake. Avoid bladder irritants. Will change therapy if culture warrants.  -     Urine  culture -     amoxicillin-clavulanate (AUGMENTIN) 875-125 MG tablet; Take 1 tablet by  mouth 2 (two) times daily for 5 days.  Dysuria -     Urinalysis, Complete  Postmenopausal vaginal bleeding Pt declines exam today. States she would prefer to go to the GYN office. Will place urgent referral. Pt to see GYN for evaluation and management.  -     Ambulatory referral to Obstetrics / Gynecology     Continue all other maintenance medications.  Follow up plan: Return in about 2 weeks (around 01/30/2019), or if symptoms worsen or fail to improve, for vaginal bleeding, UTI.  Educational handout given for UTI  The above assessment and management plan was discussed with the patient. The patient verbalized understanding of and has agreed to the management plan. Patient is aware to call the clinic if symptoms persist or worsen. Patient is aware when to return to the clinic for a follow-up visit. Patient educated on when it is appropriate to go to the emergency department.   Monia Pouch, FNP-C [facility] [phone]

## 2019-01-18 LAB — URINE CULTURE

## 2019-01-23 ENCOUNTER — Other Ambulatory Visit: Payer: Self-pay

## 2019-01-23 ENCOUNTER — Ambulatory Visit (INDEPENDENT_AMBULATORY_CARE_PROVIDER_SITE_OTHER): Payer: Medicare Other | Admitting: *Deleted

## 2019-01-23 DIAGNOSIS — Z0000 Encounter for general adult medical examination without abnormal findings: Secondary | ICD-10-CM

## 2019-01-23 NOTE — Progress Notes (Signed)
MEDICARE ANNUAL WELLNESS VISIT  01/23/2019  Telephone Visit Disclaimer This Medicare AWV was conducted by telephone due to national recommendations for restrictions regarding the COVID-19 Pandemic (e.g. social distancing).  I verified, using two identifiers, that I am speaking with Melanie Todd or their authorized healthcare agent. I discussed the limitations, risks, security, and privacy concerns of performing an evaluation and management service by telephone and the potential availability of an in-person appointment in the future. The patient expressed understanding and agreed to proceed.   Subjective:  Melanie Todd is a 78 y.o. female patient of Stacks, Cletus Gash, MD who had a Medicare Annual Wellness Visit today via telephone. Melanie Todd is Retired and lives with their spouse. she has 2 children. she reports that she is socially active and does interact with friends/family regularly. she is moderately physically active and enjoys gardening.  Patient Care Team: Claretta Fraise, MD as PCP - General (Family Medicine)  Advanced Directives 01/23/2019  Does Patient Have a Medical Advance Directive? No  Would patient like information on creating a medical advance directive? No - Patient declined    Hospital Utilization Over the Past 12 Months: # of hospitalizations or ER visits: 0 # of surgeries: 0  Review of Systems    Patient reports that her overall health is unchanged compared to last year.  Patient Reported Readings (BP, Pulse, CBG, Weight, etc) none  Review of Systems: No complaints  All other systems negative.  Pain Assessment Pain : No/denies pain     Current Medications & Allergies (verified) Allergies as of 01/23/2019      Reactions   Codeine    Sulfa Antibiotics       Medication List       Accurate as of January 23, 2019  4:09 PM. If you have any questions, ask your nurse or doctor.        celecoxib 200 MG capsule Commonly known as:  CELEBREX Take 1 capsule (200 mg  total) by mouth 2 (two) times daily.   clotrimazole 10 MG troche Commonly known as:  MYCELEX Allow one to dissolve in the mouth 5 times daily For yeast   conjugated estrogens vaginal cream Commonly known as:  PREMARIN Use 1 gram vaginally 3 times weekly   fluticasone 50 MCG/ACT nasal spray Commonly known as:  FLONASE Place 2 sprays into both nostrils daily.   hydrOXYzine 25 MG tablet Commonly known as:  ATARAX/VISTARIL Take 1-2 tablets (25-50 mg total) by mouth at bedtime.   ONE DAILY MULTIVITAMIN WOMEN PO Take 1 capsule by mouth daily.   pantoprazole 40 MG tablet Commonly known as:  PROTONIX Take 1 tablet (40 mg total) by mouth daily.   traMADol 50 MG tablet Commonly known as:  ULTRAM Take 1 tablet (50 mg total) by mouth daily.   Verapamil HCl CR 300 MG Cp24 TAKE 1 CAPSULE BY MOUTH EVERY DAY FOR BLOOD PRESSURE AS DIRECTED ORALLY 90 DAYS       History (reviewed): Past Medical History:  Diagnosis Date  . GERD (gastroesophageal reflux disease)   . Hemorrhoids   . Hypertension    Past Surgical History:  Procedure Laterality Date  . CHOLECYSTECTOMY    . HEMORROIDECTOMY    . SPINE SURGERY    . VAGINAL HYSTERECTOMY  1971   still has ovaries and tubes   Family History  Problem Relation Age of Onset  . Diabetes Mother   . Heart disease Mother   . Osteoporosis Mother   . Cancer Father  colon  . Cancer Brother        colon  . Osteoporosis Maternal Grandmother    Social History   Socioeconomic History  . Marital status: Married    Spouse name: Shanon Brow  . Number of children: 2  . Years of education: 53  . Highest education level: High school graduate  Occupational History  . Occupation: Retired  Scientific laboratory technician  . Financial resource strain: Not hard at all  . Food insecurity:    Worry: Never true    Inability: Never true  . Transportation needs:    Medical: No    Non-medical: No  Tobacco Use  . Smoking status: Former Smoker    Last attempt to  quit: 03/04/2003    Years since quitting: 15.9  . Smokeless tobacco: Never Used  Substance and Sexual Activity  . Alcohol use: No  . Drug use: No  . Sexual activity: Not Currently  Lifestyle  . Physical activity:    Days per week: 4 days    Minutes per session: 40 min  . Stress: Not at all  Relationships  . Social connections:    Talks on phone: More than three times a week    Gets together: Twice a week    Attends religious service: More than 4 times per year    Active member of club or organization: Yes    Attends meetings of clubs or organizations: More than 4 times per year    Relationship status: Married  Other Topics Concern  . Not on file  Social History Narrative  . Not on file    Activities of Daily Living In your present state of health, do you have any difficulty performing the following activities: 01/23/2019  Hearing? N  Vision? N  Difficulty concentrating or making decisions? N  Walking or climbing stairs? N  Dressing or bathing? N  Doing errands, shopping? N  Preparing Food and eating ? N  Using the Toilet? N  In the past six months, have you accidently leaked urine? N  Do you have problems with loss of bowel control? N  Managing your Medications? N  Managing your Finances? N  Housekeeping or managing your Housekeeping? N  Some recent data might be hidden    Patient Literacy How often do you need to have someone help you when you read instructions, pamphlets, or other written materials from your doctor or pharmacy?: 1 - Never What is the last grade level you completed in school?: 12th grade  Exercise Current Exercise Habits: Home exercise routine, Type of exercise: walking, Time (Minutes): 40, Frequency (Times/Week): 4, Weekly Exercise (Minutes/Week): 160, Exercise limited by: None identified  Diet Patient reports consuming 2 meals a day and 1 snack(s) a day Patient reports that her primary diet is: Regular Patient reports that she does have regular  access to food.   Depression Screen PHQ 2/9 Scores 01/23/2019 01/16/2019 09/17/2018 05/15/2018 01/25/2018 09/25/2017 07/27/2017  PHQ - 2 Score 0 0 0 0 0 0 0     Fall Risk Fall Risk  01/23/2019 01/16/2019 09/17/2018 05/15/2018 01/25/2018  Falls in the past year? 0 0 0 No No     Objective:  Melanie Todd seemed alert and oriented and she participated appropriately during our telephone visit.  Blood Pressure Weight BMI  BP Readings from Last 3 Encounters:  01/16/19 (!) 143/80  09/17/18 132/78  08/16/18 (!) 158/81   Wt Readings from Last 3 Encounters:  01/16/19 155 lb (70.3 kg)  09/17/18 158  lb 2 oz (71.7 kg)  08/16/18 156 lb 3.2 oz (70.9 kg)   BMI Readings from Last 1 Encounters:  01/16/19 26.61 kg/m    *Unable to obtain current vital signs, weight, and BMI due to telephone visit type  Hearing/Vision  . Syanne did not seem to have difficulty with hearing/understanding during the telephone conversation . Reports that she has not had a formal eye exam by an eye care professional within the past year . Reports that she has not had a formal hearing evaluation within the past year *Unable to fully assess hearing and vision during telephone visit type  Cognitive Function: 6CIT Screen 01/23/2019  What Year? 0 points  What month? 0 points  What time? 0 points  Count back from 20 0 points  Months in reverse 2 points  Repeat phrase 2 points  Total Score 4    Normal Cognitive Function Screening: Yes (Normal:0-7, Significant for Dysfunction: >8)  Immunization & Health Maintenance Record Immunization History  Administered Date(s) Administered  . Influenza, High Dose Seasonal PF 06/21/2018  . Influenza, Seasonal, Injecte, Preservative Fre 05/27/2014  . Influenza,inj,Quad PF,6+ Mos 05/27/2014, 07/03/2018  . Influenza-Unspecified 07/01/2017  . Pneumococcal Polysaccharide-23 01/24/2014  . Zoster Recombinat (Shingrix) 08/16/2018    Health Maintenance  Topic Date Due  . Samul Dada  08/23/1959   . PNA vac Low Risk Adult (2 of 2 - PCV13) 01/25/2015  . INFLUENZA VACCINE  03/22/2019  . DEXA SCAN  Completed       Assessment  This is a routine wellness examination for Melanie Todd.  Health Maintenance: Due or Overdue Health Maintenance Due  Topic Date Due  . TETANUS/TDAP  08/23/1958  . PNA vac Low Risk Adult (2 of 2 - PCV13) 01/25/2015    Melanie Todd does not need a referral for Community Assistance: Care Management:   no Social Work:    no Prescription Assistance:  no Nutrition/Diabetes Education:  no   Plan:  Personalized Goals Goals Addressed            This Visit's Progress   . Patient Stated (pt-stated)       Pt states she would like to lose about 10 pounds      Personalized Health Maintenance & Screening Recommendations  Pneumococcal vaccine  Td vaccine Complete Shingrix Vaccine Series  Lung Cancer Screening Recommended: no (Low Dose CT Chest recommended if Age 61-80 years, 30 pack-year currently smoking OR have quit w/in past 15 years) Hepatitis C Screening recommended: no HIV Screening recommended: no  Advanced Directives: Written information was not prepared per patient's request.  Referrals & Orders No orders of the defined types were placed in this encounter.   Follow-up Plan . Follow-up with Claretta Fraise, MD as planned . Consider TDAP, Prevnar and Shingles Vaccines    I have personally reviewed and noted the following in the patient's chart:   . Medical and social history . Use of alcohol, tobacco or illicit drugs  . Current medications and supplements . Functional ability and status . Nutritional status . Physical activity . Advanced directives . List of other physicians . Hospitalizations, surgeries, and ER visits in previous 12 months . Vitals . Screenings to include cognitive, depression, and falls . Referrals and appointments  In addition, I have reviewed and discussed with Melanie Todd certain preventive protocols, quality  metrics, and best practice recommendations. A written personalized care plan for preventive services as well as general preventive health recommendations is available and can be mailed to the patient  at her request.      Marylin Crosby  01/23/2019

## 2019-01-23 NOTE — Patient Instructions (Signed)
Preventive Care 79 Years and Older, Female Preventive care refers to lifestyle choices and visits with your health care provider that can promote health and wellness. What does preventive care include?  A yearly physical exam. This is also called an annual well check.  Dental exams once or twice a year.  Routine eye exams. Ask your health care provider how often you should have your eyes checked.  Personal lifestyle choices, including: ? Daily care of your teeth and gums. ? Regular physical activity. ? Eating a healthy diet. ? Avoiding tobacco and drug use. ? Limiting alcohol use. ? Practicing safe sex. ? Taking low-dose aspirin every day. ? Taking vitamin and mineral supplements as recommended by your health care provider. What happens during an annual well check? The services and screenings done by your health care provider during your annual well check will depend on your age, overall health, lifestyle risk factors, and family history of disease. Counseling Your health care provider may ask you questions about your:  Alcohol use.  Tobacco use.  Drug use.  Emotional well-being.  Home and relationship well-being.  Sexual activity.  Eating habits.  History of falls.  Memory and ability to understand (cognition).  Work and work Statistician.  Reproductive health.  Screening You may have the following tests or measurements:  Height, weight, and BMI.  Blood pressure.  Lipid and cholesterol levels. These may be checked every 5 years, or more frequently if you are over 30 years old.  Skin check.  Lung cancer screening. You may have this screening every year starting at age 27 if you have a 30-pack-year history of smoking and currently smoke or have quit within the past 15 years.  Colorectal cancer screening. All adults should have this screening starting at age 33 and continuing until age 46. You will have tests every 1-10 years, depending on your results and the  type of screening test. People at increased risk should start screening at an earlier age. Screening tests may include: ? Guaiac-based fecal occult blood testing. ? Fecal immunochemical test (FIT). ? Stool DNA test. ? Virtual colonoscopy. ? Sigmoidoscopy. During this test, a flexible tube with a tiny camera (sigmoidoscope) is used to examine your rectum and lower colon. The sigmoidoscope is inserted through your anus into your rectum and lower colon. ? Colonoscopy. During this test, a long, thin, flexible tube with a tiny camera (colonoscope) is used to examine your entire colon and rectum.  Hepatitis C blood test.  Hepatitis B blood test.  Sexually transmitted disease (STD) testing.  Diabetes screening. This is done by checking your blood sugar (glucose) after you have not eaten for a while (fasting). You may have this done every 1-3 years.  Bone density scan. This is done to screen for osteoporosis. You may have this done starting at age 37.  Mammogram. This may be done every 1-2 years. Talk to your health care provider about how often you should have regular mammograms. Talk with your health care provider about your test results, treatment options, and if necessary, the need for more tests. Vaccines Your health care provider may recommend certain vaccines, such as:  Influenza vaccine. This is recommended every year.  Tetanus, diphtheria, and acellular pertussis (Tdap, Td) vaccine. You may need a Td booster every 10 years.  Varicella vaccine. You may need this if you have not been vaccinated.  Zoster vaccine. You may need this after age 38.  Measles, mumps, and rubella (MMR) vaccine. You may need at least  one dose of MMR if you were born in 1957 or later. You may also need a second dose.  Pneumococcal 13-valent conjugate (PCV13) vaccine. One dose is recommended after age 24.  Pneumococcal polysaccharide (PPSV23) vaccine. One dose is recommended after age 24.  Meningococcal  vaccine. You may need this if you have certain conditions.  Hepatitis A vaccine. You may need this if you have certain conditions or if you travel or work in places where you may be exposed to hepatitis A.  Hepatitis B vaccine. You may need this if you have certain conditions or if you travel or work in places where you may be exposed to hepatitis B.  Haemophilus influenzae type b (Hib) vaccine. You may need this if you have certain conditions. Talk to your health care provider about which screenings and vaccines you need and how often you need them. This information is not intended to replace advice given to you by your health care provider. Make sure you discuss any questions you have with your health care provider. Document Released: 09/03/2015 Document Revised: 09/27/2017 Document Reviewed: 06/08/2015 Elsevier Interactive Patient Education  2019 [employer].

## 2019-01-24 ENCOUNTER — Telehealth: Payer: Self-pay | Admitting: *Deleted

## 2019-01-24 NOTE — Telephone Encounter (Signed)
Patient informed we are still not allowing any visitors or children to come in during appointment time unless physical assistance is needed. Asked if has had any exposure to anyone suspected or confirmed of having COVID-19 or if she was experiencing any of the following, to reschedule: fever, cough, shortness of breath, muscle pain, diarrhea, rash, vomiting, abdominal pain, red eye, weakness, bruising, bleeding, joint pain, or a severe headache.  Stated no to all.  Advised to call our office on arrival in our office parking lot to complete registration over the phone. Advised to also use the provided hand sanitizer when entering the office and to wear a mask if she has one, if not, we will provide one. Pt verbalized understanding.

## 2019-01-27 ENCOUNTER — Other Ambulatory Visit: Payer: Self-pay

## 2019-01-27 ENCOUNTER — Ambulatory Visit (INDEPENDENT_AMBULATORY_CARE_PROVIDER_SITE_OTHER): Payer: Medicare Other | Admitting: Adult Health

## 2019-01-27 ENCOUNTER — Encounter: Payer: Self-pay | Admitting: Adult Health

## 2019-01-27 VITALS — BP 131/66 | HR 69 | Ht 64.0 in | Wt 155.8 lb

## 2019-01-27 DIAGNOSIS — R300 Dysuria: Secondary | ICD-10-CM | POA: Insufficient documentation

## 2019-01-27 DIAGNOSIS — N952 Postmenopausal atrophic vaginitis: Secondary | ICD-10-CM | POA: Diagnosis not present

## 2019-01-27 DIAGNOSIS — R10813 Right lower quadrant abdominal tenderness: Secondary | ICD-10-CM

## 2019-01-27 DIAGNOSIS — N939 Abnormal uterine and vaginal bleeding, unspecified: Secondary | ICD-10-CM | POA: Insufficient documentation

## 2019-01-27 LAB — POCT URINALYSIS DIPSTICK OB
Blood, UA: NEGATIVE
Glucose, UA: NEGATIVE
Ketones, UA: NEGATIVE
Leukocytes, UA: NEGATIVE
Nitrite, UA: NEGATIVE
POC,PROTEIN,UA: NEGATIVE

## 2019-01-27 NOTE — Progress Notes (Signed)
Patient ID: Melanie Todd, female   DOB: 1940/12/29, 78 y.o.   MRN: [id number] History of Present Illness: Melanie Todd is a 78 year old white female, married, stop hysterectomy in complaining of vaginal bleeding for about 2 months has stopped since taking antibiotics for UTI. Has burning in lower stomach and with urination and sex. PCP is r St[facility].    Current Medications, Allergies, Past Medical History, Past Surgical History, Family History and Social History were reviewed in [employer] record.     Review of Systems: Had vaginal bleeding for about 2 months, stopped after taking antibiotic for UTI Has burning with urination and in lower stomach Has burning with sex.     Physical Exam:BP 131/66 (BP Location: Left Arm, Patient Position: Sitting, Cuff Size: Normal)   Pulse 69   Ht 5\' 4"  (1.626 m)   Wt 155 lb 12.8 oz (70.7 kg)   BMI 26.74 kg/m   Urine dipstick is negative.  General:  Well developed, well nourished, no acute distress Skin:  Warm and dry Lungs; Clear to auscultation bilaterally Cardiovascular: Regular rate and rhythm Pelvic:  External genitalia is normal in appearance, no lesions.  The vagina is pale and atrophic. Urethra has no lesions or masses. The cervix and uterus are absent.  No adnexal masses, +RLQ tenderness noted.Bladder is non tender, no masses felt. Psych:  No mood changes, alert and cooperative,seems happy Fall risk is low. PHQ  2 score 0. Examination chaperoned by Estill Bamberg Rash LPN.  Impression: 1. Vaginal bleeding   2. Right lower quadrant abdominal tenderness without rebound tenderness   3. Vaginal atrophy   4. Dysuria       Plan: Continue PVC,  use 0.5 gm daily til sees me Return in about a 1 week for GYN [place] and then see me 2-3 days later, will review [place] and recheck vaginal tissue

## 2019-01-30 ENCOUNTER — Telehealth: Payer: Self-pay | Admitting: Family Medicine

## 2019-01-30 NOTE — Telephone Encounter (Signed)
Pt aware 6 mos appt needs to be made, appt made 01/31/19

## 2019-01-30 NOTE — Telephone Encounter (Signed)
PT is needing refills sent to [facility]  pantoprazole (PROTONIX) 40 MG tablet     traMADol (ULTRAM) 50 MG tablet    Verapamil HCl CR 300 MG CP24    conjugated estrogens (PREMARIN) vaginal cream  celecoxib (CELEBREX) 200 MG capsule

## 2019-01-31 ENCOUNTER — Telehealth: Payer: Self-pay | Admitting: Adult Health

## 2019-01-31 ENCOUNTER — Ambulatory Visit (INDEPENDENT_AMBULATORY_CARE_PROVIDER_SITE_OTHER): Payer: Medicare Other | Admitting: Family Medicine

## 2019-01-31 ENCOUNTER — Other Ambulatory Visit: Payer: Self-pay

## 2019-01-31 DIAGNOSIS — R10813 Right lower quadrant abdominal tenderness: Secondary | ICD-10-CM

## 2019-01-31 DIAGNOSIS — I10 Essential (primary) hypertension: Secondary | ICD-10-CM | POA: Diagnosis not present

## 2019-01-31 DIAGNOSIS — G959 Disease of spinal cord, unspecified: Secondary | ICD-10-CM | POA: Diagnosis not present

## 2019-01-31 DIAGNOSIS — N939 Abnormal uterine and vaginal bleeding, unspecified: Secondary | ICD-10-CM

## 2019-01-31 MED ORDER — FLUTICASONE PROPIONATE 50 MCG/ACT NA SUSP: 2.0000 | Freq: Every day | NASAL | 6 refills | Status: AC

## 2019-01-31 MED ORDER — ESTROGENS, CONJUGATED 0.625 MG/GM VA CREA: TOPICAL_CREAM | VAGINAL | 3 refills | Status: DC

## 2019-01-31 MED ORDER — PANTOPRAZOLE SODIUM 40 MG PO TBEC: 40.0000 mg | DELAYED_RELEASE_TABLET | Freq: Every day | ORAL | 1 refills | Status: DC

## 2019-01-31 MED ORDER — CELECOXIB 200 MG PO CAPS: 200.0000 mg | ORAL_CAPSULE | Freq: Two times a day (BID) | ORAL | 1 refills | Status: DC

## 2019-01-31 MED ORDER — VERAPAMIL HCL ER 300 MG PO CP24: ORAL_CAPSULE | ORAL | 1 refills | Status: DC

## 2019-01-31 MED ORDER — METRONIDAZOLE 0.75 % VA GEL: 1.0000 | Freq: Two times a day (BID) | VAGINAL | 0 refills | Status: AC

## 2019-01-31 NOTE — Progress Notes (Signed)
Subjective:    Patient ID: Melanie Todd, female    DOB: 31-Oct-1940, 78 y.o.   MRN: [id number]   HPI: Melanie Todd is a 78 y.o. female presenting for not feeling good. Having moderate vaginal bleeding. Has had a partial hysterectomy.  Saw GYN MD 3 days ago.. Supposed to get [place] for passing blood vaginally. Smelled like infection yesterday so she called a former  Called GYN in [place] and they sent in [employer]. They were able to move the ultrasound up to next week.  BP yesterday was 130/60.   Depression screen [facility] 2/9 01/27/2019 01/23/2019 01/16/2019 09/17/2018 05/15/2018  Decreased Interest 0 0 0 0 0  Down, Depressed, Hopeless 0 0 0 0 0  PHQ - 2 Score 0 0 0 0 0     Relevant past medical, surgical, family and social history reviewed and updated as indicated.  Interim medical history since our last visit reviewed. Allergies and medications reviewed and updated.  ROS:  Review of Systems  Constitutional: Negative.   HENT: Negative for congestion.   Eyes: Negative for visual disturbance.  Respiratory: Negative for shortness of breath.   Cardiovascular: Negative for chest pain.  Gastrointestinal: Negative for abdominal pain, constipation, diarrhea, nausea and vomiting.  Genitourinary: Positive for menstrual problem, pelvic pain, vaginal bleeding and vaginal pain. Negative for difficulty urinating, dysuria and flank pain.  Musculoskeletal: Negative for arthralgias and myalgias.  Neurological: Negative for headaches.  Psychiatric/Behavioral: Negative for sleep disturbance.     Social History   Tobacco Use  Smoking Status Former Smoker  . Quit date: 03/04/2003  . Years since quitting: 15.9  Smokeless Tobacco Never Used       Objective:     Wt Readings from Last 3 Encounters:  01/27/19 155 lb 12.8 oz (70.7 kg)  01/16/19 155 lb (70.3 kg)  09/17/18 158 lb 2 oz (71.7 kg)     Exam deferred. Pt. Harboring due to COVID 19. Phone visit performed.   Assessment & Plan:   1. Cervical  myelopathy ([place])   2. Essential hypertension     Meds ordered this encounter  Medications  . celecoxib (CELEBREX) 200 MG capsule    Sig: Take 1 capsule (200 mg total) by mouth 2 (two) times daily.    Dispense:  180 capsule    Refill:  1  . Verapamil HCl CR 300 MG CP24    Sig: TAKE 1 CAPSULE BY MOUTH EVERY DAY FOR BLOOD PRESSURE AS DIRECTED ORALLY 90 DAYS    Dispense:  90 capsule    Refill:  1  . pantoprazole (PROTONIX) 40 MG tablet    Sig: Take 1 tablet (40 mg total) by mouth daily.    Dispense:  90 tablet    Refill:  1  . fluticasone (FLONASE) 50 MCG/ACT nasal spray    Sig: Place 2 sprays into both nostrils daily.    Dispense:  16 g    Refill:  6  . conjugated estrogens (PREMARIN) vaginal cream    Sig: Use 1 gram vaginally 3 times weekly    Dispense:  42.5 g    Refill:  3    No orders of the defined types were placed in this encounter.     Diagnoses and all orders for this visit:  Cervical myelopathy ([name]) -     celecoxib (CELEBREX) 200 MG capsule; Take 1 capsule (200 mg total) by mouth 2 (two) times daily.  Essential hypertension -     Verapamil HCl CR 300 MG  CP24; TAKE 1 CAPSULE BY MOUTH EVERY DAY FOR BLOOD PRESSURE AS DIRECTED ORALLY 90 DAYS  Other orders -     pantoprazole (PROTONIX) 40 MG tablet; Take 1 tablet (40 mg total) by mouth daily. -     fluticasone (FLONASE) 50 MCG/ACT nasal spray; Place 2 sprays into both nostrils daily. -     conjugated estrogens (PREMARIN) vaginal cream; Use 1 gram vaginally 3 times weekly    Virtual Visit via telephone Note  I discussed the limitations, risks, security and privacy concerns of performing an evaluation and management service by telephone and the availability of in person appointments. The patient was identified with two identifiers. Pt.expressed understanding and agreed to proceed. Pt. Is at home. Dr. Livia Snellen is in his office.  Follow Up Instructions:   I discussed the assessment and treatment plan with the  patient. The patient was provided an opportunity to ask questions and all were answered. The patient agreed with the plan and demonstrated an understanding of the instructions.   The patient was advised to call back or seek an in-person evaluation if the symptoms worsen or if the condition fails to improve as anticipated.   Total minutes including chart review and phone contact time: 24   Follow up plan: Keep apt. For ultrasound and set up appointment with Lucianne Lei (former pt. Who wishes to return.) Return if symptoms worsen or fail to improve.  Claretta Fraise, MD [place]

## 2019-01-31 NOTE — Telephone Encounter (Signed)
Melanie Todd would like for Anderson Malta to give her  A call.

## 2019-01-31 NOTE — Addendum Note (Signed)
Addended by: Derrek Monaco A on: 01/31/2019 12:45 PM   Modules accepted: Orders

## 2019-01-31 NOTE — Telephone Encounter (Signed)
Has odor and still having pain, has been using PVC, will rx Metrogel and will get [place] at [facility] and cancel one in office, she does not want to wait. [place] at [facility]  6/18 at 12:30

## 2019-02-01 ENCOUNTER — Encounter: Payer: Self-pay | Admitting: Family Medicine

## 2019-02-04 DIAGNOSIS — Z01419 Encounter for gynecological examination (general) (routine) without abnormal findings: Secondary | ICD-10-CM | POA: Diagnosis not present

## 2019-02-04 DIAGNOSIS — M545 Low back pain: Secondary | ICD-10-CM | POA: Diagnosis not present

## 2019-02-04 DIAGNOSIS — N83201 Unspecified ovarian cyst, right side: Secondary | ICD-10-CM | POA: Diagnosis not present

## 2019-02-04 DIAGNOSIS — N952 Postmenopausal atrophic vaginitis: Secondary | ICD-10-CM | POA: Diagnosis not present

## 2019-02-04 DIAGNOSIS — N939 Abnormal uterine and vaginal bleeding, unspecified: Secondary | ICD-10-CM | POA: Diagnosis not present

## 2019-02-06 ENCOUNTER — Ambulatory Visit (HOSPITAL_COMMUNITY): Admission: RE | Admit: 2019-02-06 | Payer: Medicare Other | Source: Ambulatory Visit

## 2019-02-10 ENCOUNTER — Other Ambulatory Visit: Payer: Medicare Other

## 2019-02-12 ENCOUNTER — Ambulatory Visit: Payer: Medicare Other | Admitting: Adult Health

## 2019-03-11 ENCOUNTER — Other Ambulatory Visit: Payer: Self-pay | Admitting: Family Medicine

## 2019-03-11 ENCOUNTER — Telehealth: Payer: Self-pay | Admitting: Family Medicine

## 2019-03-11 DIAGNOSIS — I10 Essential (primary) hypertension: Secondary | ICD-10-CM

## 2019-03-11 DIAGNOSIS — G959 Disease of spinal cord, unspecified: Secondary | ICD-10-CM

## 2019-03-11 MED ORDER — ESTROGENS, CONJUGATED 0.625 MG/GM VA CREA: TOPICAL_CREAM | VAGINAL | 3 refills | Status: AC

## 2019-03-11 NOTE — Telephone Encounter (Signed)
Tramadol always needs to be filled in a visit, if her provider is off then she may be seen by another provider who is covering, and still needs to be in a visit

## 2019-03-11 NOTE — Telephone Encounter (Signed)
What is the name of the medication?  traMADol (ULTRAM) 50 MG tablet Verapamil HCl CR 300 MG CP24 celecoxib (CELEBREX) 200 MG capsule pantoprazole (PROTONIX) 40 MG tablet conjugated estrogens (PREMARIN) vaginal cream  Have you contacted your pharmacy to request a refill? Yes [facility]  [phone]   Patient notified that their request is being sent to the clinical staff for review and that they should receive a call once it is complete. If they do not receive a call within 24 hours they can check with their pharmacy or our office.

## 2019-03-11 NOTE — Telephone Encounter (Signed)
Patient aware and states she is upset since Dr. Livia Snellen was supposed to fill it at the last visit.

## 2019-03-11 NOTE — Telephone Encounter (Signed)
All meds except for Tramadol were sent to [facility] today. What do I tell her about this since he is off for 2 weeks?

## 2019-03-13 DIAGNOSIS — J309 Allergic rhinitis, unspecified: Secondary | ICD-10-CM | POA: Diagnosis not present

## 2019-03-13 DIAGNOSIS — J329 Chronic sinusitis, unspecified: Secondary | ICD-10-CM | POA: Diagnosis not present

## 2019-03-13 DIAGNOSIS — F5104 Psychophysiologic insomnia: Secondary | ICD-10-CM | POA: Diagnosis not present

## 2019-03-13 DIAGNOSIS — G8929 Other chronic pain: Secondary | ICD-10-CM | POA: Diagnosis not present

## 2019-03-13 DIAGNOSIS — N952 Postmenopausal atrophic vaginitis: Secondary | ICD-10-CM | POA: Diagnosis not present

## 2019-03-13 DIAGNOSIS — K219 Gastro-esophageal reflux disease without esophagitis: Secondary | ICD-10-CM | POA: Diagnosis not present

## 2019-03-13 DIAGNOSIS — M545 Low back pain: Secondary | ICD-10-CM | POA: Diagnosis not present

## 2019-03-13 DIAGNOSIS — R51 Headache: Secondary | ICD-10-CM | POA: Diagnosis not present

## 2019-03-13 DIAGNOSIS — I10 Essential (primary) hypertension: Secondary | ICD-10-CM | POA: Diagnosis not present

## 2019-03-13 DIAGNOSIS — E7849 Other hyperlipidemia: Secondary | ICD-10-CM | POA: Diagnosis not present

## 2019-03-13 DIAGNOSIS — Z5181 Encounter for therapeutic drug level monitoring: Secondary | ICD-10-CM | POA: Diagnosis not present

## 2019-03-18 DIAGNOSIS — N952 Postmenopausal atrophic vaginitis: Secondary | ICD-10-CM | POA: Diagnosis not present

## 2019-03-18 DIAGNOSIS — N83201 Unspecified ovarian cyst, right side: Secondary | ICD-10-CM | POA: Diagnosis not present

## 2019-03-28 DIAGNOSIS — N9489 Other specified conditions associated with female genital organs and menstrual cycle: Secondary | ICD-10-CM | POA: Diagnosis not present

## 2019-04-16 DIAGNOSIS — J329 Chronic sinusitis, unspecified: Secondary | ICD-10-CM | POA: Diagnosis not present

## 2019-04-16 DIAGNOSIS — I10 Essential (primary) hypertension: Secondary | ICD-10-CM | POA: Diagnosis not present

## 2019-05-01 DIAGNOSIS — Z8719 Personal history of other diseases of the digestive system: Secondary | ICD-10-CM | POA: Diagnosis not present

## 2019-05-01 DIAGNOSIS — D013 Carcinoma in situ of anus and anal canal: Secondary | ICD-10-CM | POA: Diagnosis not present

## 2019-06-08 DIAGNOSIS — Z23 Encounter for immunization: Secondary | ICD-10-CM | POA: Diagnosis not present

## 2019-06-13 DIAGNOSIS — H40023 Open angle with borderline findings, high risk, bilateral: Secondary | ICD-10-CM | POA: Diagnosis not present

## 2019-06-13 DIAGNOSIS — H2513 Age-related nuclear cataract, bilateral: Secondary | ICD-10-CM | POA: Diagnosis not present

## 2019-06-17 DIAGNOSIS — J0101 Acute recurrent maxillary sinusitis: Secondary | ICD-10-CM | POA: Diagnosis not present

## 2019-06-17 DIAGNOSIS — M545 Low back pain: Secondary | ICD-10-CM | POA: Diagnosis not present

## 2019-06-17 DIAGNOSIS — G8929 Other chronic pain: Secondary | ICD-10-CM | POA: Diagnosis not present

## 2019-06-17 DIAGNOSIS — K219 Gastro-esophageal reflux disease without esophagitis: Secondary | ICD-10-CM | POA: Diagnosis not present

## 2019-06-17 DIAGNOSIS — I10 Essential (primary) hypertension: Secondary | ICD-10-CM | POA: Diagnosis not present

## 2019-06-17 DIAGNOSIS — E782 Mixed hyperlipidemia: Secondary | ICD-10-CM | POA: Diagnosis not present

## 2019-09-11 DIAGNOSIS — J019 Acute sinusitis, unspecified: Secondary | ICD-10-CM | POA: Diagnosis not present

## 2019-09-11 DIAGNOSIS — K1370 Unspecified lesions of oral mucosa: Secondary | ICD-10-CM | POA: Diagnosis not present

## 2019-10-06 DIAGNOSIS — Z23 Encounter for immunization: Secondary | ICD-10-CM | POA: Diagnosis not present

## 2019-10-17 DIAGNOSIS — K1230 Oral mucositis (ulcerative), unspecified: Secondary | ICD-10-CM | POA: Diagnosis not present

## 2019-10-27 DIAGNOSIS — Z23 Encounter for immunization: Secondary | ICD-10-CM | POA: Diagnosis not present

## 2019-10-29 ENCOUNTER — Telehealth: Payer: Self-pay | Admitting: Family Medicine

## 2019-10-29 NOTE — Chronic Care Management (AMB) (Signed)
  Chronic Care Management   Note  10/29/2019 Name: Melanie Todd MRN: [id number] DOB: 1940-09-28  [name] is a 79 y.o. year old female who is a primary care patient of Stacks, Cletus Gash, MD. I reached out to [name] by phone today in response to a referral sent by [name] health plan.     [name] was given information about Chronic Care Management services today including:  1. CCM service includes personalized support from designated clinical staff supervised by her physician, including individualized plan of care and coordination with other care providers 2. 24/7 contact phone numbers for assistance for urgent and routine care needs. 3. Service will only be billed when office clinical staff spend 20 minutes or more in a month to coordinate care. 4. Only one practitioner may furnish and bill the service in a calendar month. 5. The patient may stop CCM services at any time (effective at the end of the month) by phone call to the office staff. 6. The patient will be responsible for cost sharing (co-pay) of up to 20% of the service fee (after annual deductible is met).  Patient agreed to services and verbal consent obtained.   Follow up plan: Telephone appointment with care management team member scheduled for: 01/23/2020.  [place],  [id number] Direct Dial: [phone] [email or web address]

## 2019-12-18 DIAGNOSIS — J309 Allergic rhinitis, unspecified: Secondary | ICD-10-CM | POA: Diagnosis not present

## 2019-12-18 DIAGNOSIS — Z13220 Encounter for screening for lipoid disorders: Secondary | ICD-10-CM | POA: Diagnosis not present

## 2019-12-18 DIAGNOSIS — I10 Essential (primary) hypertension: Secondary | ICD-10-CM | POA: Diagnosis not present

## 2019-12-18 DIAGNOSIS — M1990 Unspecified osteoarthritis, unspecified site: Secondary | ICD-10-CM | POA: Diagnosis not present

## 2019-12-18 DIAGNOSIS — B370 Candidal stomatitis: Secondary | ICD-10-CM | POA: Diagnosis not present

## 2019-12-18 DIAGNOSIS — K219 Gastro-esophageal reflux disease without esophagitis: Secondary | ICD-10-CM | POA: Diagnosis not present

## 2019-12-22 DIAGNOSIS — H40023 Open angle with borderline findings, high risk, bilateral: Secondary | ICD-10-CM | POA: Diagnosis not present

## 2019-12-26 ENCOUNTER — Telehealth: Payer: Self-pay | Admitting: Family Medicine

## 2019-12-26 NOTE — Telephone Encounter (Signed)
FYI: Pt called to cancel her CCM appt scheduled on 01/23/20. Pt did not want to reschedule. Clair Gulling a message regarding this.

## 2020-01-23 ENCOUNTER — Telehealth: Payer: Medicare Other

## 2020-01-27 IMAGING — US US ABDOMEN COMPLETE
1 series · 13 of 25 positions shown · non-contrast
Comparison: None.

CLINICAL DATA: 77-year-old female with nausea and mid abdominal
pain for 4 weeks. Prior cholecystectomy. Initial encounter.

EXAM:
ABDOMEN ULTRASOUND COMPLETE

[Series 1: us abdomen complete · 0.19mm/px · 13 of 92 slices shown]
[im 1/92]
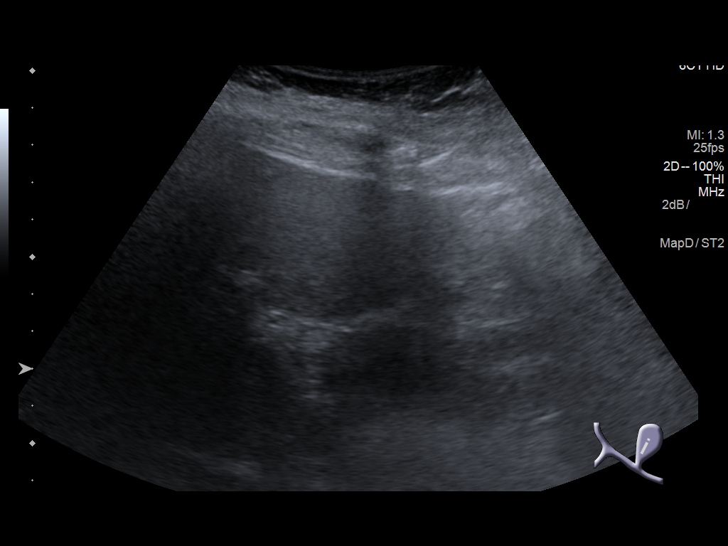
[im 8/92]
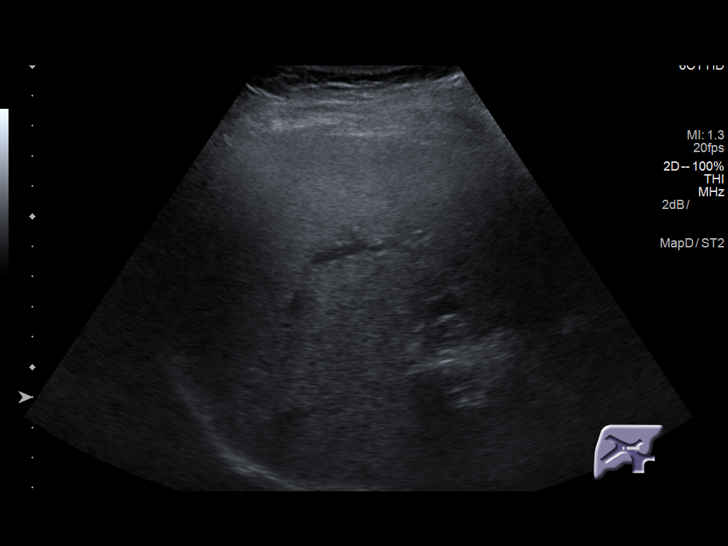
[im 16/92]
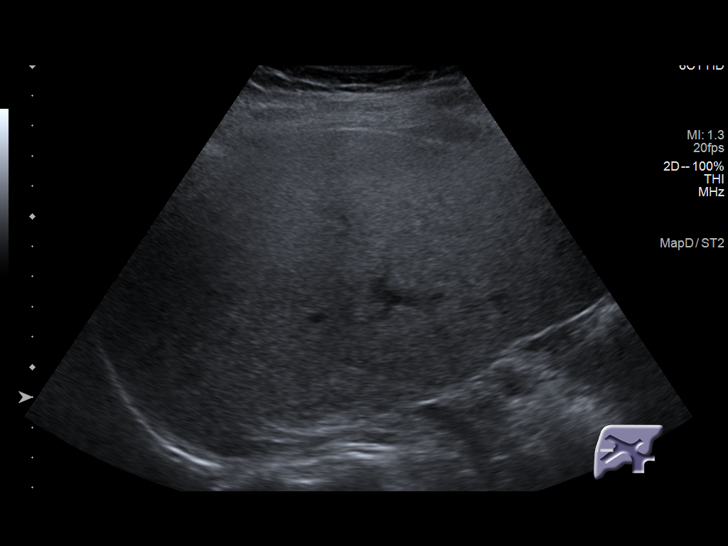
[im 23/92]
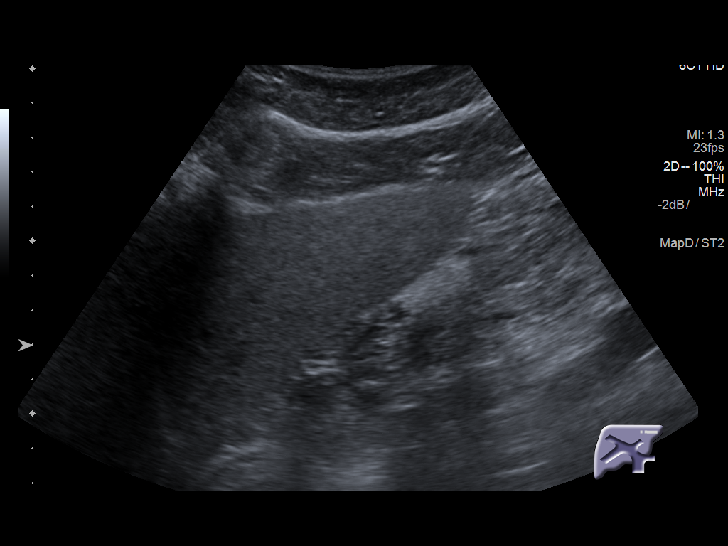
[im 31/92]
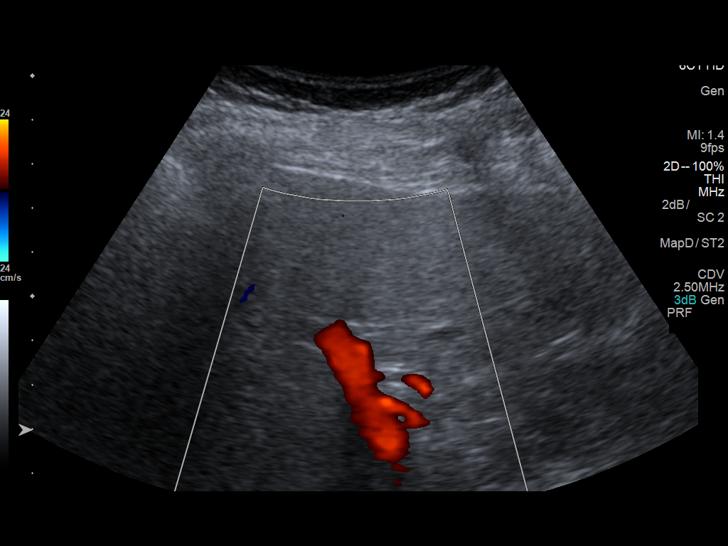
[im 38/92]
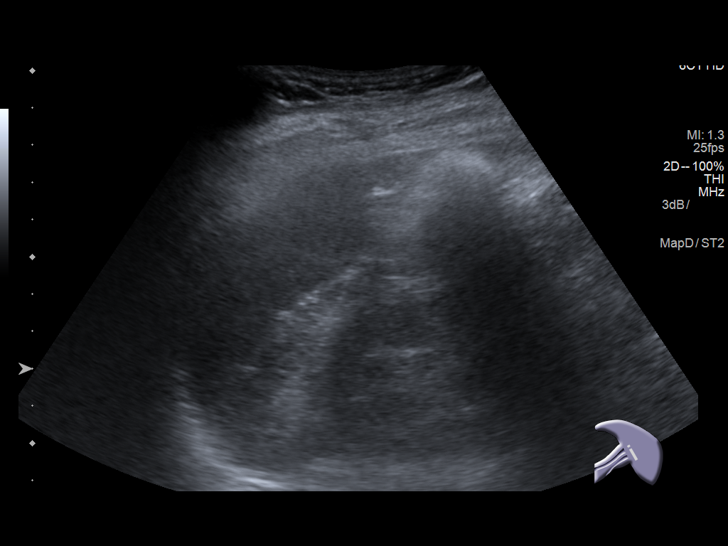
[im 46/92]
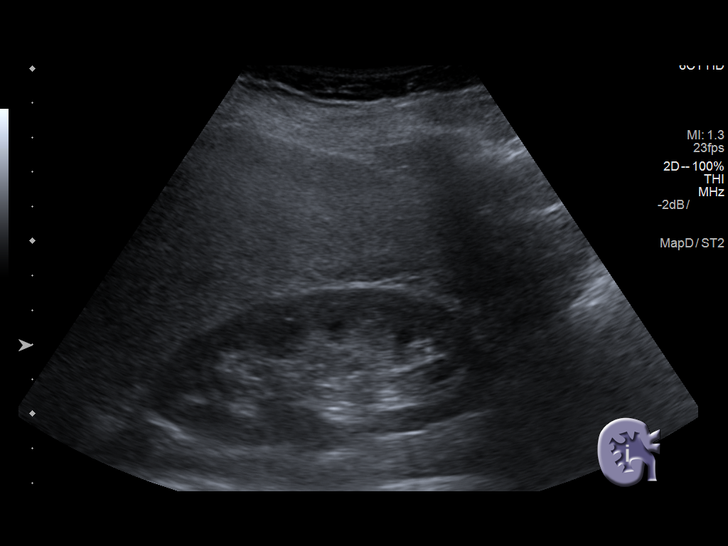
[im 54/92]
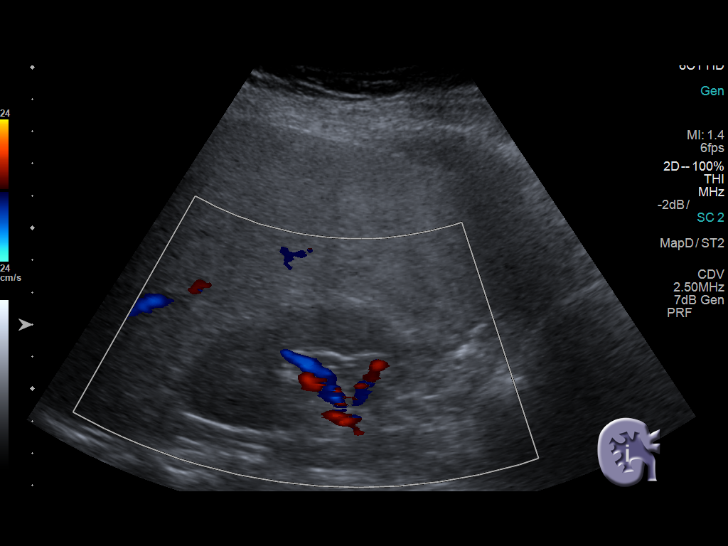
[im 61/92]
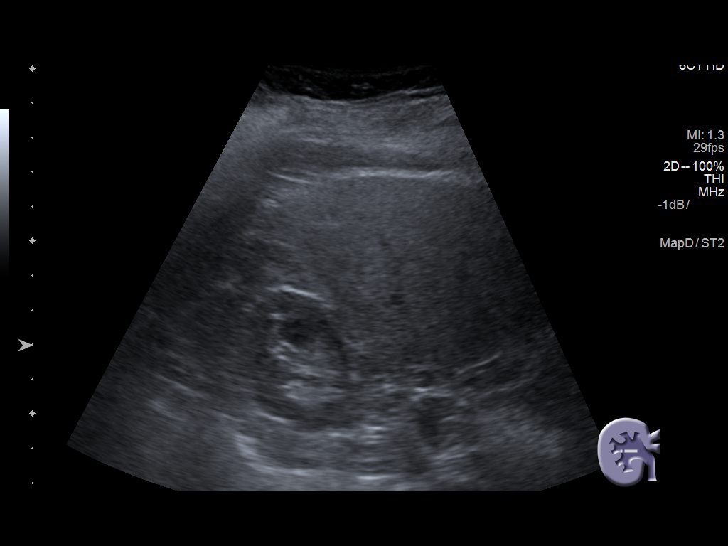
[im 69/92]
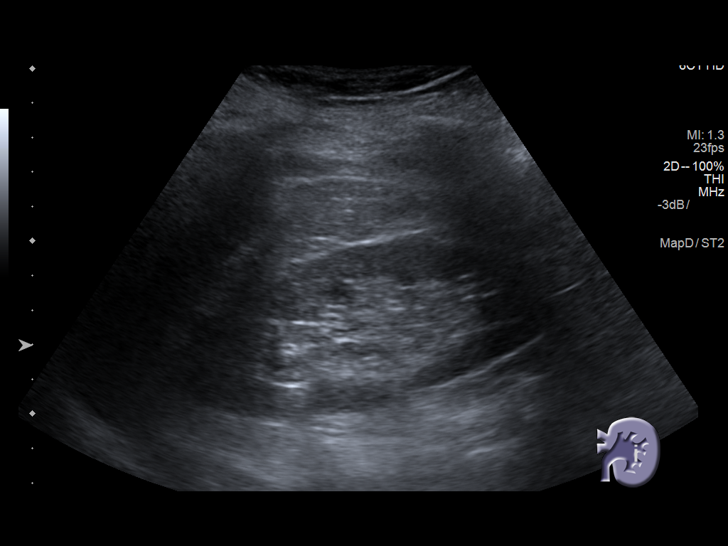
[im 76/92]
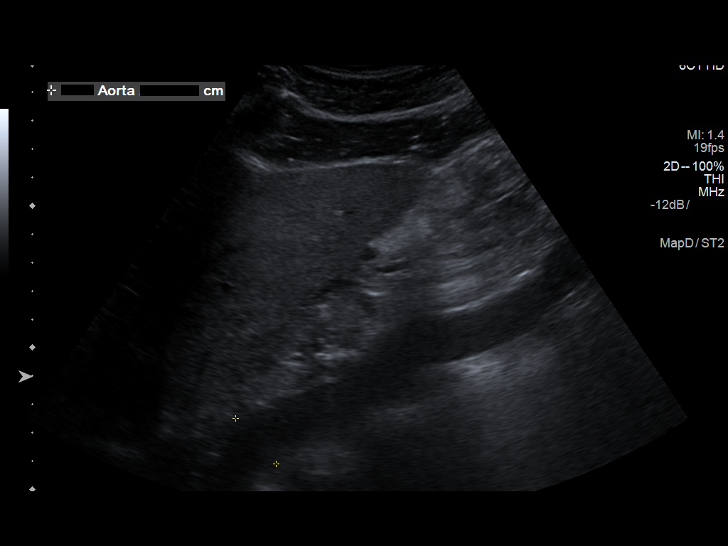
[im 84/92]
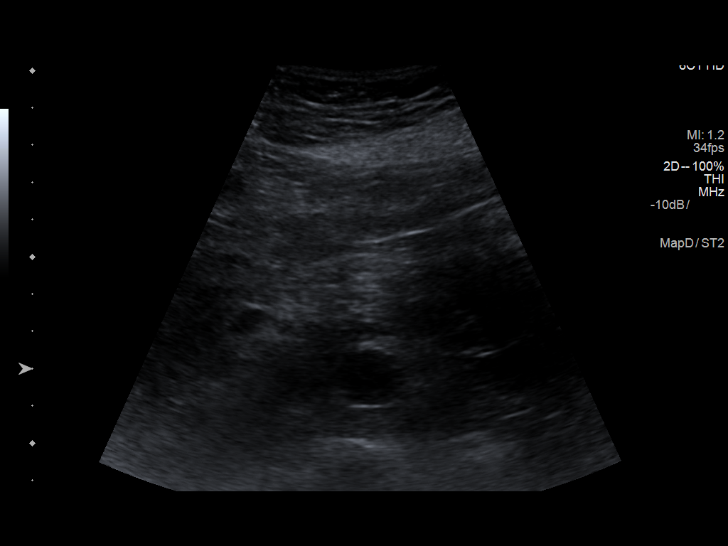
[im 92/92]
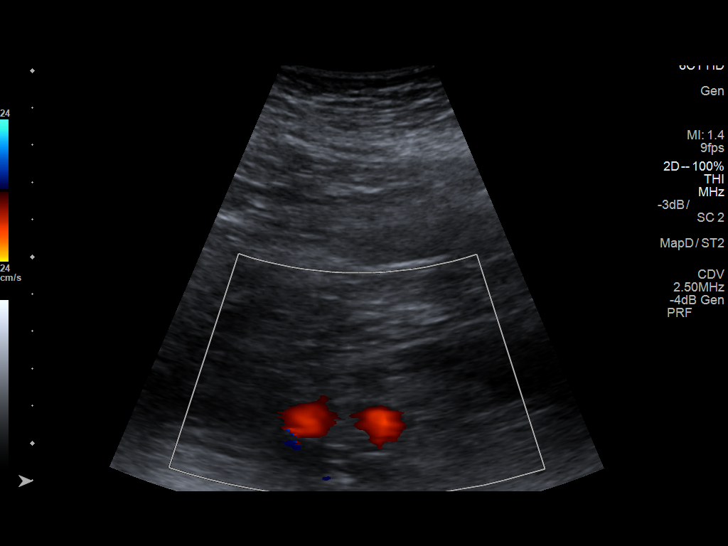

[13 of 25 positions shown; findings below may reference images not displayed]

FINDINGS: Gallbladder: Post cholecystectomy.

Common bile duct: Diameter: 7.9 mm.

Liver: Liver of increased echogenicity consistent with fatty
infiltration and/or hepatocellular disease. No focal hepatic lesion.
Portal vein is patent on color Doppler imaging with normal direction
of blood flow towards the liver.

IVC: No abnormality visualized.

Pancreas: Suboptimally evaluated secondary to habitus and bowel gas.
Portions visualized unremarkable.

Spleen: Size and appearance within normal limits.

Right Kidney: Length: 10.3 cm. Echogenicity within normal limits. No
mass or hydronephrosis visualized.

Left Kidney: Length: 10.5 cm. Echogenicity within normal limits. No
mass or hydronephrosis visualized.

Abdominal aorta: Suboptimally evaluated secondary to habitus and
bowel gas. Atherosclerotic changes. No aneurysm visualized.

Other findings: None.
IMPRESSION: Post cholecystectomy.

Liver of increased echogenicity consistent with fatty infiltration
and/or hepatocellular disease. No focal hepatic lesion.

Slightly suboptimal evaluation of portions of the pancreas and aorta
secondary to bowel gas.

## 2020-03-17 DIAGNOSIS — I10 Essential (primary) hypertension: Secondary | ICD-10-CM | POA: Diagnosis not present

## 2020-03-17 DIAGNOSIS — K219 Gastro-esophageal reflux disease without esophagitis: Secondary | ICD-10-CM | POA: Diagnosis not present

## 2020-03-17 DIAGNOSIS — B370 Candidal stomatitis: Secondary | ICD-10-CM | POA: Diagnosis not present

## 2020-03-17 DIAGNOSIS — K5900 Constipation, unspecified: Secondary | ICD-10-CM | POA: Diagnosis not present

## 2020-03-17 DIAGNOSIS — M1990 Unspecified osteoarthritis, unspecified site: Secondary | ICD-10-CM | POA: Diagnosis not present

## 2020-03-17 DIAGNOSIS — J309 Allergic rhinitis, unspecified: Secondary | ICD-10-CM | POA: Diagnosis not present

## 2020-03-24 DIAGNOSIS — R42 Dizziness and giddiness: Secondary | ICD-10-CM | POA: Diagnosis not present

## 2020-04-08 ENCOUNTER — Ambulatory Visit: Payer: Medicare Other | Admitting: Family Medicine

## 2020-04-08 DIAGNOSIS — R519 Headache, unspecified: Secondary | ICD-10-CM | POA: Diagnosis not present

## 2020-04-08 DIAGNOSIS — K219 Gastro-esophageal reflux disease without esophagitis: Secondary | ICD-10-CM | POA: Diagnosis not present

## 2020-04-16 DIAGNOSIS — I10 Essential (primary) hypertension: Secondary | ICD-10-CM | POA: Diagnosis not present

## 2020-04-16 DIAGNOSIS — L538 Other specified erythematous conditions: Secondary | ICD-10-CM | POA: Diagnosis not present

## 2020-04-16 DIAGNOSIS — Z7951 Long term (current) use of inhaled steroids: Secondary | ICD-10-CM | POA: Diagnosis not present

## 2020-04-16 DIAGNOSIS — Z79899 Other long term (current) drug therapy: Secondary | ICD-10-CM | POA: Diagnosis not present

## 2020-04-16 DIAGNOSIS — K120 Recurrent oral aphthae: Secondary | ICD-10-CM | POA: Diagnosis not present

## 2020-04-16 DIAGNOSIS — Z87891 Personal history of nicotine dependence: Secondary | ICD-10-CM | POA: Diagnosis not present

## 2020-04-16 DIAGNOSIS — Z79818 Long term (current) use of other agents affecting estrogen receptors and estrogen levels: Secondary | ICD-10-CM | POA: Diagnosis not present

## 2020-04-16 DIAGNOSIS — K219 Gastro-esophageal reflux disease without esophagitis: Secondary | ICD-10-CM | POA: Diagnosis not present

## 2020-04-16 DIAGNOSIS — Z882 Allergy status to sulfonamides status: Secondary | ICD-10-CM | POA: Diagnosis not present

## 2020-04-16 DIAGNOSIS — Z885 Allergy status to narcotic agent status: Secondary | ICD-10-CM | POA: Diagnosis not present

## 2020-06-09 DIAGNOSIS — H40059 Ocular hypertension, unspecified eye: Secondary | ICD-10-CM | POA: Diagnosis not present

## 2020-06-09 DIAGNOSIS — H2513 Age-related nuclear cataract, bilateral: Secondary | ICD-10-CM | POA: Diagnosis not present

## 2020-06-23 DIAGNOSIS — I10 Essential (primary) hypertension: Secondary | ICD-10-CM | POA: Diagnosis not present

## 2020-06-23 DIAGNOSIS — M1990 Unspecified osteoarthritis, unspecified site: Secondary | ICD-10-CM | POA: Diagnosis not present

## 2020-06-23 DIAGNOSIS — J309 Allergic rhinitis, unspecified: Secondary | ICD-10-CM | POA: Diagnosis not present

## 2020-06-23 DIAGNOSIS — K219 Gastro-esophageal reflux disease without esophagitis: Secondary | ICD-10-CM | POA: Diagnosis not present

## 2020-06-23 DIAGNOSIS — K5900 Constipation, unspecified: Secondary | ICD-10-CM | POA: Diagnosis not present

## 2020-06-28 DIAGNOSIS — Z23 Encounter for immunization: Secondary | ICD-10-CM | POA: Diagnosis not present

## 2020-09-29 DIAGNOSIS — C44319 Basal cell carcinoma of skin of other parts of face: Secondary | ICD-10-CM | POA: Diagnosis not present

## 2020-09-29 DIAGNOSIS — C44612 Basal cell carcinoma of skin of right upper limb, including shoulder: Secondary | ICD-10-CM | POA: Diagnosis not present

## 2020-09-29 DIAGNOSIS — L578 Other skin changes due to chronic exposure to nonionizing radiation: Secondary | ICD-10-CM | POA: Diagnosis not present

## 2020-09-29 DIAGNOSIS — L821 Other seborrheic keratosis: Secondary | ICD-10-CM | POA: Diagnosis not present

## 2020-09-29 DIAGNOSIS — C44311 Basal cell carcinoma of skin of nose: Secondary | ICD-10-CM | POA: Diagnosis not present

## 2020-10-05 DIAGNOSIS — N390 Urinary tract infection, site not specified: Secondary | ICD-10-CM | POA: Diagnosis not present

## 2020-10-05 DIAGNOSIS — R300 Dysuria: Secondary | ICD-10-CM | POA: Diagnosis not present

## 2020-10-06 DIAGNOSIS — C44319 Basal cell carcinoma of skin of other parts of face: Secondary | ICD-10-CM | POA: Diagnosis not present

## 2020-10-06 DIAGNOSIS — C44622 Squamous cell carcinoma of skin of right upper limb, including shoulder: Secondary | ICD-10-CM | POA: Diagnosis not present

## 2020-10-19 DIAGNOSIS — R300 Dysuria: Secondary | ICD-10-CM | POA: Diagnosis not present

## 2020-11-22 DIAGNOSIS — H9202 Otalgia, left ear: Secondary | ICD-10-CM | POA: Diagnosis not present

## 2020-11-22 DIAGNOSIS — I10 Essential (primary) hypertension: Secondary | ICD-10-CM | POA: Diagnosis not present

## 2021-03-02 DIAGNOSIS — C44319 Basal cell carcinoma of skin of other parts of face: Secondary | ICD-10-CM | POA: Diagnosis not present

## 2021-03-02 DIAGNOSIS — C44622 Squamous cell carcinoma of skin of right upper limb, including shoulder: Secondary | ICD-10-CM | POA: Diagnosis not present

## 2021-04-19 DIAGNOSIS — K1379 Other lesions of oral mucosa: Secondary | ICD-10-CM | POA: Diagnosis not present

## 2021-04-19 DIAGNOSIS — H6121 Impacted cerumen, right ear: Secondary | ICD-10-CM | POA: Diagnosis not present

## 2021-04-19 DIAGNOSIS — K121 Other forms of stomatitis: Secondary | ICD-10-CM | POA: Diagnosis not present

## 2021-05-25 DIAGNOSIS — J029 Acute pharyngitis, unspecified: Secondary | ICD-10-CM | POA: Diagnosis not present

## 2021-05-25 DIAGNOSIS — N9489 Other specified conditions associated with female genital organs and menstrual cycle: Secondary | ICD-10-CM | POA: Diagnosis not present

## 2021-05-25 DIAGNOSIS — Z03818 Encounter for observation for suspected exposure to other biological agents ruled out: Secondary | ICD-10-CM | POA: Diagnosis not present

## 2021-06-02 DIAGNOSIS — I10 Essential (primary) hypertension: Secondary | ICD-10-CM | POA: Diagnosis not present

## 2021-06-02 DIAGNOSIS — J029 Acute pharyngitis, unspecified: Secondary | ICD-10-CM | POA: Diagnosis not present

## 2021-06-02 DIAGNOSIS — G959 Disease of spinal cord, unspecified: Secondary | ICD-10-CM | POA: Diagnosis not present

## 2021-06-02 DIAGNOSIS — E782 Mixed hyperlipidemia: Secondary | ICD-10-CM | POA: Diagnosis not present

## 2021-06-02 DIAGNOSIS — G8929 Other chronic pain: Secondary | ICD-10-CM | POA: Diagnosis not present

## 2021-06-02 DIAGNOSIS — K219 Gastro-esophageal reflux disease without esophagitis: Secondary | ICD-10-CM | POA: Diagnosis not present

## 2021-06-02 DIAGNOSIS — M545 Low back pain, unspecified: Secondary | ICD-10-CM | POA: Diagnosis not present

## 2021-06-11 DIAGNOSIS — Z882 Allergy status to sulfonamides status: Secondary | ICD-10-CM | POA: Diagnosis not present

## 2021-06-11 DIAGNOSIS — I10 Essential (primary) hypertension: Secondary | ICD-10-CM | POA: Diagnosis not present

## 2021-06-11 DIAGNOSIS — K529 Noninfective gastroenteritis and colitis, unspecified: Secondary | ICD-10-CM | POA: Diagnosis not present

## 2021-06-11 DIAGNOSIS — K5730 Diverticulosis of large intestine without perforation or abscess without bleeding: Secondary | ICD-10-CM | POA: Diagnosis not present

## 2021-06-11 DIAGNOSIS — R10819 Abdominal tenderness, unspecified site: Secondary | ICD-10-CM | POA: Diagnosis not present

## 2021-06-11 DIAGNOSIS — Z888 Allergy status to other drugs, medicaments and biological substances status: Secondary | ICD-10-CM | POA: Diagnosis not present

## 2021-06-11 DIAGNOSIS — K219 Gastro-esophageal reflux disease without esophagitis: Secondary | ICD-10-CM | POA: Diagnosis not present

## 2021-06-11 DIAGNOSIS — R109 Unspecified abdominal pain: Secondary | ICD-10-CM | POA: Diagnosis not present

## 2021-06-11 DIAGNOSIS — Z87891 Personal history of nicotine dependence: Secondary | ICD-10-CM | POA: Diagnosis not present

## 2021-06-11 DIAGNOSIS — K6389 Other specified diseases of intestine: Secondary | ICD-10-CM | POA: Diagnosis not present

## 2021-06-11 DIAGNOSIS — R1084 Generalized abdominal pain: Secondary | ICD-10-CM | POA: Diagnosis not present

## 2021-06-11 DIAGNOSIS — Z79899 Other long term (current) drug therapy: Secondary | ICD-10-CM | POA: Diagnosis not present

## 2021-06-14 DIAGNOSIS — Z20822 Contact with and (suspected) exposure to covid-19: Secondary | ICD-10-CM | POA: Diagnosis not present

## 2021-06-14 DIAGNOSIS — R590 Localized enlarged lymph nodes: Secondary | ICD-10-CM | POA: Diagnosis not present

## 2021-06-14 DIAGNOSIS — K219 Gastro-esophageal reflux disease without esophagitis: Secondary | ICD-10-CM | POA: Diagnosis not present

## 2021-06-14 DIAGNOSIS — M545 Low back pain, unspecified: Secondary | ICD-10-CM | POA: Diagnosis not present

## 2021-06-14 DIAGNOSIS — K921 Melena: Secondary | ICD-10-CM | POA: Diagnosis not present

## 2021-06-14 DIAGNOSIS — R10817 Generalized abdominal tenderness: Secondary | ICD-10-CM | POA: Diagnosis not present

## 2021-06-14 DIAGNOSIS — Z885 Allergy status to narcotic agent status: Secondary | ICD-10-CM | POA: Diagnosis not present

## 2021-06-14 DIAGNOSIS — Z792 Long term (current) use of antibiotics: Secondary | ICD-10-CM | POA: Diagnosis not present

## 2021-06-14 DIAGNOSIS — R100 Acute abdomen: Secondary | ICD-10-CM | POA: Diagnosis not present

## 2021-06-14 DIAGNOSIS — Z79899 Other long term (current) drug therapy: Secondary | ICD-10-CM | POA: Diagnosis not present

## 2021-06-14 DIAGNOSIS — K529 Noninfective gastroenteritis and colitis, unspecified: Secondary | ICD-10-CM | POA: Diagnosis not present

## 2021-06-14 DIAGNOSIS — Z882 Allergy status to sulfonamides status: Secondary | ICD-10-CM | POA: Diagnosis not present

## 2021-06-14 DIAGNOSIS — G8929 Other chronic pain: Secondary | ICD-10-CM | POA: Diagnosis not present

## 2021-06-14 DIAGNOSIS — K760 Fatty (change of) liver, not elsewhere classified: Secondary | ICD-10-CM | POA: Diagnosis not present

## 2021-06-14 DIAGNOSIS — Z23 Encounter for immunization: Secondary | ICD-10-CM | POA: Diagnosis not present

## 2021-06-14 DIAGNOSIS — R109 Unspecified abdominal pain: Secondary | ICD-10-CM | POA: Diagnosis not present

## 2021-06-14 DIAGNOSIS — R7309 Other abnormal glucose: Secondary | ICD-10-CM | POA: Diagnosis not present

## 2021-06-14 DIAGNOSIS — I10 Essential (primary) hypertension: Secondary | ICD-10-CM | POA: Diagnosis not present

## 2021-06-14 DIAGNOSIS — E1165 Type 2 diabetes mellitus with hyperglycemia: Secondary | ICD-10-CM | POA: Diagnosis not present

## 2021-06-14 DIAGNOSIS — F17210 Nicotine dependence, cigarettes, uncomplicated: Secondary | ICD-10-CM | POA: Diagnosis not present

## 2021-06-14 DIAGNOSIS — K625 Hemorrhage of anus and rectum: Secondary | ICD-10-CM | POA: Diagnosis not present

## 2021-06-15 DIAGNOSIS — K529 Noninfective gastroenteritis and colitis, unspecified: Secondary | ICD-10-CM | POA: Diagnosis not present

## 2021-06-15 DIAGNOSIS — K219 Gastro-esophageal reflux disease without esophagitis: Secondary | ICD-10-CM | POA: Diagnosis not present

## 2021-06-15 DIAGNOSIS — K625 Hemorrhage of anus and rectum: Secondary | ICD-10-CM | POA: Diagnosis not present

## 2021-06-15 DIAGNOSIS — R7309 Other abnormal glucose: Secondary | ICD-10-CM | POA: Diagnosis not present

## 2021-06-16 NOTE — Telephone Encounter (Signed)
Opened in error

## 2021-06-21 DIAGNOSIS — Z23 Encounter for immunization: Secondary | ICD-10-CM | POA: Diagnosis not present

## 2021-06-28 DIAGNOSIS — K219 Gastro-esophageal reflux disease without esophagitis: Secondary | ICD-10-CM | POA: Diagnosis not present

## 2021-06-28 DIAGNOSIS — K529 Noninfective gastroenteritis and colitis, unspecified: Secondary | ICD-10-CM | POA: Diagnosis not present

## 2021-06-28 DIAGNOSIS — K648 Other hemorrhoids: Secondary | ICD-10-CM | POA: Diagnosis not present

## 2021-06-28 DIAGNOSIS — K625 Hemorrhage of anus and rectum: Secondary | ICD-10-CM | POA: Diagnosis not present

## 2021-07-05 DIAGNOSIS — R7309 Other abnormal glucose: Secondary | ICD-10-CM | POA: Diagnosis not present

## 2021-07-05 DIAGNOSIS — E782 Mixed hyperlipidemia: Secondary | ICD-10-CM | POA: Diagnosis not present

## 2021-07-05 DIAGNOSIS — E1165 Type 2 diabetes mellitus with hyperglycemia: Secondary | ICD-10-CM | POA: Diagnosis not present

## 2021-07-05 DIAGNOSIS — I10 Essential (primary) hypertension: Secondary | ICD-10-CM | POA: Diagnosis not present

## 2021-07-05 DIAGNOSIS — K529 Noninfective gastroenteritis and colitis, unspecified: Secondary | ICD-10-CM | POA: Diagnosis not present

## 2021-08-03 DIAGNOSIS — E119 Type 2 diabetes mellitus without complications: Secondary | ICD-10-CM | POA: Diagnosis not present

## 2021-08-03 DIAGNOSIS — Z7984 Long term (current) use of oral hypoglycemic drugs: Secondary | ICD-10-CM | POA: Diagnosis not present

## 2021-08-03 DIAGNOSIS — H2513 Age-related nuclear cataract, bilateral: Secondary | ICD-10-CM | POA: Diagnosis not present

## 2021-08-03 DIAGNOSIS — H40023 Open angle with borderline findings, high risk, bilateral: Secondary | ICD-10-CM | POA: Diagnosis not present

## 2021-08-03 DIAGNOSIS — H43812 Vitreous degeneration, left eye: Secondary | ICD-10-CM | POA: Diagnosis not present

## 2021-08-03 DIAGNOSIS — H43811 Vitreous degeneration, right eye: Secondary | ICD-10-CM | POA: Diagnosis not present

## 2021-09-04 ENCOUNTER — Emergency Department (INDEPENDENT_AMBULATORY_CARE_PROVIDER_SITE_OTHER)
Admission: EM | Admit: 2021-09-04 | Discharge: 2021-09-04 | Disposition: A | Discharge status: Home / Self Care | Payer: Medicare Other | Source: Home / Self Care

## 2021-09-04 ENCOUNTER — Other Ambulatory Visit: Payer: Self-pay

## 2021-09-04 DIAGNOSIS — H6123 Impacted cerumen, bilateral: Secondary | ICD-10-CM

## 2021-09-04 DIAGNOSIS — R42 Dizziness and giddiness: Secondary | ICD-10-CM

## 2021-09-04 HISTORY — DX: Diverticulitis of intestine, part unspecified, without perforation or abscess without bleeding: K57.92

## 2021-09-04 MED ORDER — ONDANSETRON 4 MG PO TBDP
4.0000 mg | ORAL_TABLET | Freq: Once | ORAL | Status: AC
  Administered 2021-09-04: 4 mg via ORAL

## 2021-09-04 MED ORDER — MECLIZINE HCL 25 MG PO TABS: 25.0000 mg | ORAL_TABLET | Freq: Three times a day (TID) | ORAL | 0 refills | Status: AC | PRN

## 2021-09-04 NOTE — Discharge Instructions (Addendum)
Advised patient to take medication as directed advised patient if dizziness/vertigo worsens and/or unresolved please follow-up with PCP or here for further evaluation.

## 2021-09-04 NOTE — ED Provider Notes (Signed)
Melanie Todd CARE    CSN: [id number] Arrival date & time: 09/04/21  1328      History   Chief Complaint Chief Complaint  Patient presents with   Dizziness   Headache    HPI Melanie Todd is a 81 y.o. female.   HPI 81 year old female presents with headache and dizziness influenced by positioning since this morning.  Patient reports that she is a little nauseated.  Additionally patient reports some burning of left ear yesterday but not today and has had previous episodes of vertigo several years ago.  PMH significant for BPPV and HTN.  Past Medical History:  Diagnosis Date   Diverticulitis    GERD (gastroesophageal reflux disease)    Hemorrhoids    Hypertension     Patient Active Problem List   Diagnosis Date Noted   Vaginal bleeding 01/27/2019   Right lower quadrant abdominal tenderness without rebound tenderness 01/27/2019   Vaginal atrophy 01/27/2019   Dysuria 01/27/2019   Postmenopausal vaginal bleeding 01/16/2019   Osteopenia 05/27/2014   HTN (hypertension) 03/03/2013   GERD (gastroesophageal reflux disease) 03/03/2013   HLD (hyperlipidemia) 03/03/2013   Benign paroxysmal positional vertigo 03/03/2013   Allergic rhinitis 03/03/2013   Deviated nasal septum 03/03/2013   Sinusitis, acute 03/03/2013    Past Surgical History:  Procedure Laterality Date   CHOLECYSTECTOMY     HEMORROIDECTOMY     [place]   still has ovaries and tubes    OB History     Gravida  2   Para  2   Term  2   Preterm      AB      Living  2      SAB      IAB      Ectopic      Multiple      Live Births               Home Medications    Prior to Admission medications   Medication Sig Start Date End Date Taking? Authorizing Provider  meclizine (ANTIVERT) 25 MG tablet Take 1 tablet (25 mg total) by mouth 3 (three) times daily as needed for dizziness. 09/04/21  Yes Eliezer Lofts, FNP  CELEBREX 200 MG capsule TAKE ONE  CAPSULE BY MOUTH TWICE A DAY (MAY TAKE WITH FOOD) 03/11/19   Claretta Fraise, MD  clotrimazole [facility]) 10 MG troche Allow one to dissolve in the mouth 5 times daily For yeast Patient not taking: Reported on 01/23/2019 08/16/18   Claretta Fraise, MD  conjugated estrogens (PREMARIN) vaginal cream Use 1 gram vaginally 3 times weekly 03/11/19   Claretta Fraise, MD  fluticasone [facility]) 50 MCG/ACT nasal spray Place 2 sprays into both nostrils daily. 01/31/19   Claretta Fraise, MD  hydrOXYzine (ATARAX/VISTARIL) 25 MG tablet One to two tabs at bedtime 03/11/19   Claretta Fraise, MD  metroNIDAZOLE (METROGEL) 0.75 % vaginal gel Place 1 Applicatorful vaginally 2 (two) times daily. 01/31/19   Estill Dooms, NP  Multiple Vitamins-Minerals (ONE DAILY MULTIVITAMIN WOMEN PO) Take 1 capsule by mouth daily.    [provider]  pantoprazole (PROTONIX) 40 MG tablet TAKE ONE TABLET BY MOUTH EVERY DAY 03/11/19   Claretta Fraise, MD  traMADol (ULTRAM) 50 MG tablet Take 1 tablet (50 mg total) by mouth daily. 08/16/18   Claretta Fraise, MD  Verapamil HCl CR 300 MG CP24 TAKE ONE CAPSULE BY MOUTH EVERY DAY IN THE EVENING FOR BLOOD PRESSURE  AS DIRECTED 03/11/19   Claretta Fraise, MD    Family History Family History  Problem Relation Age of Onset   Diabetes Mother    Heart disease Mother    Osteoporosis Mother    Cancer Father        colon   Cancer Brother        colon   Osteoporosis Maternal Grandmother     Social History Social History   Tobacco Use   Smoking status: Former    Types: Cigarettes    Quit date: 03/04/2003    Years since quitting: 18.5   Smokeless tobacco: Never  Vaping Use   Vaping Use: Never used  Substance Use Topics   Alcohol use: No   Drug use: No     Allergies   Codeine and Sulfa antibiotics   Review of Systems Review of Systems  Neurological:  Positive for dizziness.  All other systems reviewed and are negative.   Physical Exam Triage Vital Signs ED Triage Vitals   Enc Vitals Group     BP 09/04/21 1353 (!) 146/85     Pulse Rate 09/04/21 1353 72     Resp 09/04/21 1353 20     Temp 09/04/21 1353 97.6 F (36.4 C)     Temp Source 09/04/21 1353 Oral     SpO2 09/04/21 1353 98 %     Weight 09/04/21 1349 146 lb (66.2 kg)     Height 09/04/21 1349 5\' 4"  (1.626 m)     Head Circumference --      Peak Flow --      Pain Score 09/04/21 1348 5     Pain Loc --      Pain Edu? --      Excl. in [place]? --    No data found.  Updated Vital Signs BP (!) 146/85    Pulse 72    Temp 97.6 F (36.4 C) (Oral)    Resp 20    Ht 5\' 4"  (1.626 m)    Wt 146 lb (66.2 kg)    SpO2 98%    BMI 25.06 kg/m      Physical Exam Vitals and nursing note reviewed.  Constitutional:      General: She is not in acute distress.    Appearance: Normal appearance. She is normal weight. She is not ill-appearing.  HENT:     Head: Normocephalic and atraumatic.     Right Ear: External ear normal.     Left Ear: External ear normal.     Ears:     Comments: Unable to visualize either TM due to excessive cerumen of bilateral EAC's  Post bilateral ear lavage: left EAC-clear, left TM-dull; right EAC-clear, right TM-dull, good light reflex mildly retracted      Mouth/Throat:     Mouth: Mucous membranes are moist.     Pharynx: Oropharynx is clear.  Eyes:     Extraocular Movements: Extraocular movements intact.     Conjunctiva/sclera: Conjunctivae normal.     Pupils: Pupils are equal, round, and reactive to light.  Cardiovascular:     Rate and Rhythm: Normal rate and regular rhythm.     Pulses: Normal pulses.     Heart sounds: Normal heart sounds.  Pulmonary:     Effort: Pulmonary effort is normal.     Breath sounds: Normal breath sounds.  Musculoskeletal:     Cervical back: Normal range of motion and neck supple.  Skin:    General: Skin is warm and dry.  Neurological:     General: No focal deficit present.     Mental Status: She is alert and oriented to person, place, and time. Mental  status is at baseline.     UC Treatments / Results  Labs (all labs ordered are listed, but only abnormal results are displayed) Labs Reviewed - No data to display  EKG   Radiology No results found.  Procedures Procedures (including critical care time)  Medications Ordered in UC Medications  ondansetron (ZOFRAN-ODT) disintegrating tablet 4 mg (4 mg Oral Given 09/04/21 1418)    Initial Impression / Assessment and Plan / UC Course  I have reviewed the triage vital signs and the nursing notes.  Pertinent labs & imaging results that were available during my care of the patient were reviewed by me and considered in my medical decision making (see chart for details).     MDM: 1.  Dizziness-EKG revealed normal sinus rhythm; 2.  Excessive cerumen in both ear canals-resolved with bilateral ear lavage; 3.  Vertigo-Rx'd Meclizine. Advised patient to take medication as directed advised patient if dizziness/vertigo worsens and/or unresolved please follow-up with PCP or here for further evaluation. Final Clinical Impressions(s) / UC Diagnoses   Final diagnoses:  Excessive cerumen in both ear canals  Dizziness  Vertigo     Discharge Instructions      Advised patient to take medication as directed advised patient if dizziness/vertigo worsens and/or unresolved please follow-up with PCP or here for further evaluation.     ED Prescriptions     Medication Sig Dispense Auth. Provider   meclizine (ANTIVERT) 25 MG tablet Take 1 tablet (25 mg total) by mouth 3 (three) times daily as needed for dizziness. 30 tablet Eliezer Lofts, FNP      PDMP not reviewed this encounter.   Eliezer Lofts, [place] 09/04/21 1534

## 2021-09-04 NOTE — ED Triage Notes (Addendum)
Pt presents to Urgent Care with c/o HA and dizziness, influenced by position, since this AM. States she is "a little" nauseated.  Reports she has some "burning" in her L ear yesterday, but not today. Pt states she thinks she had an episode of vertigo several years ago. Smile symmetrical, tongue midline, no problems w/ sensation or mobility.
